# Patient Record
Sex: Male | Born: 2000 | Race: White | Hispanic: No | Marital: Single | Smoking: Former smoker
Health system: Southern US, Community
[De-identification: ages and names within clinical notes are randomized; demographics above are authoritative.]

## PROBLEM LIST (undated history)

## (undated) DIAGNOSIS — K589 Irritable bowel syndrome without diarrhea: Secondary | ICD-10-CM

## (undated) DIAGNOSIS — K219 Gastro-esophageal reflux disease without esophagitis: Secondary | ICD-10-CM

## (undated) DIAGNOSIS — J45909 Unspecified asthma, uncomplicated: Secondary | ICD-10-CM

## (undated) HISTORY — DX: Gastro-esophageal reflux disease without esophagitis: K21.9

## (undated) HISTORY — DX: Irritable bowel syndrome, unspecified: K58.9

## (undated) HISTORY — DX: Unspecified asthma, uncomplicated: J45.909

---
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
## 2018-01-20 ENCOUNTER — Encounter: Payer: Self-pay | Admitting: Family Medicine

## 2018-01-20 ENCOUNTER — Ambulatory Visit: Payer: BLUE CROSS/BLUE SHIELD | Admitting: Family Medicine

## 2018-01-20 VITALS — BP 117/78 | Temp 98.3°F | Ht 70.35 in | Wt 144.6 lb

## 2018-01-20 DIAGNOSIS — J019 Acute sinusitis, unspecified: Secondary | ICD-10-CM

## 2018-01-20 MED ORDER — AMOXICILLIN-POT CLAVULANATE 875-125 MG PO TABS: ORAL_TABLET | ORAL | 0 refills | Status: DC

## 2018-01-20 NOTE — Progress Notes (Signed)
   Subjective:    Patient ID: Jeffery Arnold, male    DOB: 01-03-2001, 17 y.o.   MRN: [id number]  Sore Throat   This is a new problem. The current episode started in the past 7 days. Associated symptoms include congestion and coughing. Associated symptoms comments: Runny nose, fatigue, fever yesterday. Treatments tried: sinus med. The treatment provided mild relief.   Senior yr, working now at [employer]   ttired and [employer] got worse, has had some spring allergy cough too   Probable low gr fever  Headache at first  [employer] disch from sinuses and chest  Review of Systems  HENT: Positive for congestion.   Respiratory: Positive for cough.        Objective:   Physical Exam  Alert, mild malaise. Hydration good Vitals stable. frontal/ maxillary tenderness evident positive nasal congestion. pharynx normal neck supple  lungs clear/no crackles or wheezes. heart regular in rhythm       Assessment & Plan:  Impression rhinosinusitis likely post viral, discussed with patient. plan antibiotics prescribed. Questions answered. Symptomatic care discussed. warning signs discussed. WSL

## 2018-05-09 DIAGNOSIS — W57XXXA Bitten or stung by nonvenomous insect and other nonvenomous arthropods, initial encounter: Secondary | ICD-10-CM | POA: Diagnosis not present

## 2018-05-09 DIAGNOSIS — S70361A Insect bite (nonvenomous), right thigh, initial encounter: Secondary | ICD-10-CM | POA: Diagnosis not present

## 2019-04-09 ENCOUNTER — Other Ambulatory Visit: Payer: Self-pay | Admitting: Family Medicine

## 2019-04-09 NOTE — Telephone Encounter (Signed)
Please contact patient and schedule appt. Thank you. 

## 2019-04-12 NOTE — Telephone Encounter (Signed)
Appt scheduled pt aware 

## 2019-04-15 ENCOUNTER — Ambulatory Visit (INDEPENDENT_AMBULATORY_CARE_PROVIDER_SITE_OTHER): Payer: BC Managed Care – PPO | Admitting: Family Medicine

## 2019-04-15 ENCOUNTER — Ambulatory Visit: Payer: BLUE CROSS/BLUE SHIELD | Admitting: Family Medicine

## 2019-04-15 ENCOUNTER — Other Ambulatory Visit: Payer: Self-pay

## 2019-04-15 DIAGNOSIS — J4520 Mild intermittent asthma, uncomplicated: Secondary | ICD-10-CM | POA: Diagnosis not present

## 2019-04-15 MED ORDER — ALBUTEROL SULFATE HFA 108 (90 BASE) MCG/ACT IN AERS: INHALATION_SPRAY | RESPIRATORY_TRACT | 2 refills | Status: DC

## 2019-04-15 NOTE — Progress Notes (Signed)
   Subjective:  AudioPatient ID: Jeffery Arnold, male    DOB: 05/26/2001, 18 y.o.   MRN: [id number] Plus video HPI Follow up on asthma. Needs refill on albuterol inhaler. 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   Virtual Visit via Video Note  I connected with Jeffery Arnold on 04/15/19 at  9:30 AM EDT by a video enabled telemedicine application and verified that I am speaking with the correct person using two identifiers.  Location: Patient: home Provider: office   I discussed the limitations of evaluation and management by telemedicine and the availability of in person appointments. The patient expressed understanding and agreed to proceed.  History of Present Illness:    Observations/Objective:   Assessment and Plan:   Follow Up Instructions:    I discussed the assessment and treatment plan with the patient. The patient was provided an opportunity to ask questions and all were answered. The patient agreed with the plan and demonstrated an understanding of the instructions.   The patient was advised to call back or seek an in-person evaluation if the symptoms worsen or if the condition fails to improve as anticipated.  I provided 16 minutes of non-face-to-face time during this encounter.       Review of Systems No headache, no major weight loss or weight gain, no chest pain no back pain abdominal pain no change in bowel habits complete ROS otherwise negative     Objective:   Physical Exam   Virtual     Assessment & Plan:  Impression history of asthma.  Overall stable now.  Now working in high irritant/chemical environment.  He anticipates intermittent flares and will need albuterol on hand.  Proper use discussed warning signs discussed

## 2019-09-23 ENCOUNTER — Ambulatory Visit: Payer: BC Managed Care – PPO | Attending: Internal Medicine

## 2019-09-23 ENCOUNTER — Other Ambulatory Visit: Payer: Self-pay

## 2019-09-23 DIAGNOSIS — Z20828 Contact with and (suspected) exposure to other viral communicable diseases: Secondary | ICD-10-CM | POA: Diagnosis not present

## 2019-09-23 DIAGNOSIS — Z20822 Contact with and (suspected) exposure to covid-19: Secondary | ICD-10-CM

## 2019-09-25 LAB — NOVEL CORONAVIRUS, NAA: SARS-CoV-2, NAA: NOT DETECTED

## 2019-10-20 ENCOUNTER — Encounter: Payer: Self-pay | Admitting: Family Medicine

## 2019-10-21 ENCOUNTER — Encounter: Payer: Self-pay | Admitting: Family Medicine

## 2019-11-01 ENCOUNTER — Ambulatory Visit: Payer: BC Managed Care – PPO | Attending: Internal Medicine

## 2019-11-01 ENCOUNTER — Other Ambulatory Visit: Payer: Self-pay

## 2019-11-01 DIAGNOSIS — Z20822 Contact with and (suspected) exposure to covid-19: Secondary | ICD-10-CM | POA: Insufficient documentation

## 2019-11-02 LAB — NOVEL CORONAVIRUS, NAA: SARS-CoV-2, NAA: NOT DETECTED

## 2019-12-31 ENCOUNTER — Ambulatory Visit
Admission: EM | Admit: 2019-12-31 | Discharge: 2019-12-31 | Disposition: A | Discharge status: Home / Self Care | Payer: BC Managed Care – PPO | Attending: Emergency Medicine | Admitting: Emergency Medicine

## 2019-12-31 ENCOUNTER — Other Ambulatory Visit: Payer: Self-pay

## 2019-12-31 ENCOUNTER — Encounter: Payer: Self-pay | Admitting: Emergency Medicine

## 2019-12-31 DIAGNOSIS — R05 Cough: Secondary | ICD-10-CM

## 2019-12-31 DIAGNOSIS — Z20822 Contact with and (suspected) exposure to covid-19: Secondary | ICD-10-CM

## 2019-12-31 DIAGNOSIS — J029 Acute pharyngitis, unspecified: Secondary | ICD-10-CM | POA: Diagnosis not present

## 2019-12-31 DIAGNOSIS — R059 Cough, unspecified: Secondary | ICD-10-CM

## 2019-12-31 LAB — POC SARS CORONAVIRUS 2 AG -  ED: SARS Coronavirus 2 Ag: NEGATIVE

## 2019-12-31 MED ORDER — CETIRIZINE HCL 10 MG PO TABS: 10.0000 mg | ORAL_TABLET | Freq: Every day | ORAL | 0 refills | Status: DC

## 2019-12-31 MED ORDER — BENZONATATE 100 MG PO CAPS: 100.0000 mg | ORAL_CAPSULE | Freq: Three times a day (TID) | ORAL | 0 refills | Status: DC

## 2019-12-31 MED ORDER — FLUTICASONE PROPIONATE 50 MCG/ACT NA SUSP: 1.0000 | Freq: Every day | NASAL | 0 refills | Status: DC

## 2019-12-31 NOTE — ED Provider Notes (Signed)
[facility]    CSN: [id number] Arrival date & time: 12/31/19  1833      History   Chief Complaint Covid exposure  HPI Jeffery Arnold is a 19 y.o. male.   who presents for COVID testing after Covid exposure.  Roommate tested positive today.  Report cough, sore throat and fever.  Denies sick exposure to COVID, flu or strep.  Denies recent travel.  Denies aggravating or alleviating symptoms.  Denies previous COVID infection.   Denies fever, chills, fatigue, nasal congestion, rhinorrhea, SOB, wheezing, chest pain, nausea, vomiting, changes in bowel or bladder habits.     The history is provided by the patient. No language interpreter was used.    Past Medical History:  Diagnosis Date  . Asthma   . GERD (gastroesophageal reflux disease)     Patient Active Problem List   Diagnosis Date Noted  . Asthma, chronic 08/25/2013    History reviewed. No pertinent surgical history.     Home Medications    Prior to Admission medications   Medication Sig Start Date End Date Taking? Authorizing Provider  albuterol (VENTOLIN HFA) 108 (90 Base) MCG/ACT inhaler Two sprays qid prn wheezing 04/15/19   Mikey Kirschner, MD  PROAIR HFA 108 [phone] Base) MCG/ACT inhaler INHALE 2 PUFFS INTO THE LUNGS EVERY 4 HOURS AS NEEDED FOR WHEEZING ORSHORNTESS OF BREATH. 04/13/19   Mikey Kirschner, MD    Family History Family History  Problem Relation Age of Onset  . Diabetes Other     Social History Social History   Tobacco Use  . Smoking status: Former Research scientist (life sciences)  . Smokeless tobacco: Never Used  Substance Use Topics  . Alcohol use: Not on file  . Drug use: Not on file     Allergies   Patient has no known allergies.   Review of Systems Review of Systems  Constitutional: Positive for fever.  HENT: Positive for sore throat.   Respiratory: Positive for cough.   Cardiovascular: Negative.   Gastrointestinal: Negative.   Neurological: Negative.   All other systems reviewed and  are negative.    Physical Exam Triage Vital Signs ED Triage Vitals [12/31/19 1838]  Enc Vitals Group     BP (!) 147/84     Pulse Rate (!) 121     Resp 17     Temp 100.1 F (37.8 C)     Temp Source Oral     SpO2 96 %     Weight      Height      Head Circumference      Peak Flow      Pain Score      Pain Loc      Pain Edu?      Excl. in [place]?    No data found.  Updated Vital Signs BP (!) 147/84 (BP Location: Right Arm)   Pulse (!) 121   Temp 100.1 F (37.8 C) (Oral)   Resp 17   SpO2 96%   Visual Acuity Right Eye Distance:   Left Eye Distance:   Bilateral Distance:    Right Eye Near:   Left Eye Near:    Bilateral Near:     Physical Exam Vitals and nursing note reviewed.  Constitutional:      General: He is not in acute distress.    Appearance: Normal appearance. He is normal weight. He is not ill-appearing or toxic-appearing.  HENT:     Head: Normocephalic.     Right Ear:  Tympanic membrane, ear canal and external ear normal. There is no impacted cerumen.     Left Ear: Tympanic membrane, ear canal and external ear normal. There is no impacted cerumen.     Nose: Nose normal. No congestion.     Mouth/Throat:     Mouth: Mucous membranes are moist.     Pharynx: Oropharynx is clear. No oropharyngeal exudate or posterior oropharyngeal erythema.  Cardiovascular:     Rate and Rhythm: Regular rhythm. Tachycardia present.     Pulses: Normal pulses.     Heart sounds: Normal heart sounds. No murmur.  Pulmonary:     Effort: Pulmonary effort is normal. No respiratory distress.     Breath sounds: Normal breath sounds. No wheezing or rhonchi.  Chest:     Chest wall: No tenderness.  Neurological:     Mental Status: He is alert and oriented to person, place, and time.      UC Treatments / Results  Labs (all labs ordered are listed, but only abnormal results are displayed) Labs Reviewed  NOVEL CORONAVIRUS, NAA  POC SARS CORONAVIRUS 2 AG -  ED     EKG   Radiology No results found.  Procedures Procedures (including critical care time)  Medications Ordered in UC Medications - No data to display  Initial Impression / Assessment and Plan / UC Course  I have reviewed the triage vital signs and the nursing notes.  Pertinent labs & imaging results that were available during my care of the patient were reviewed by me and considered in my medical decision making (see chart for details).    POCT COVID-19 test was negative.  PCR COVID-19 test was ordered.  Flonase, Tessalon Perles, Zyrtec were prescribed.  Was advised to quarantine.  Work note was given.  Return or go to ED for worsening of symptoms  Final Clinical Impressions(s) / UC Diagnoses   Final diagnoses:  Close exposure to COVID-19 virus  Cough  Sore throat     Discharge Instructions     COVID testing ordered.  It will take between 2-7 days for test results.  Someone will contact you regarding abnormal results.    In the meantime: You should remain isolated in your home for 10 days from symptom onset AND greater than 24 hours after symptoms resolution (absence of fever without the use of fever-reducing medication and improvement in respiratory symptoms), whichever is longer Get plenty of rest and push fluids Tessalon Perles prescribed for cough Zyrtec-D prescribed for nasal congestion, runny nose, and/or sore throat Flonase prescribed for nasal congestion and runny nose Use medications daily for symptom relief Use OTC medications like ibuprofen or tylenol as needed fever or pain Call or go to the ED if you have any new or worsening symptoms such as fever, worsening cough, shortness of breath, chest tightness, chest pain, turning blue, changes in mental status, etc...     ED Prescriptions    None     PDMP not reviewed this encounter.   Durward Parcel, FNP 12/31/19 1907

## 2019-12-31 NOTE — ED Triage Notes (Signed)
Pt seen by provider prior to RN, see provider note  

## 2019-12-31 NOTE — Discharge Instructions (Addendum)
COVID testing ordered.  It will take between 2-7 days for test results.  Someone will contact you regarding abnormal results.    In the meantime: You should remain isolated in your home for 10 days from symptom onset AND greater than 24 hours after symptoms resolution (absence of fever without the use of fever-reducing medication and improvement in respiratory symptoms), whichever is longer Get plenty of rest and push fluids Tessalon Perles prescribed for cough Zyrtec-D prescribed for nasal congestion, runny nose, and/or sore throat Flonase prescribed for nasal congestion and runny nose Use medications daily for symptom relief Use OTC medications like ibuprofen or tylenol as needed fever or pain Call or go to the ED if you have any new or worsening symptoms such as fever, worsening cough, shortness of breath, chest tightness, chest pain, turning blue, changes in mental status, etc...  

## 2020-01-02 LAB — SARS-COV-2, NAA 2 DAY TAT

## 2020-01-02 LAB — NOVEL CORONAVIRUS, NAA: SARS-CoV-2, NAA: DETECTED — AB

## 2020-03-22 ENCOUNTER — Other Ambulatory Visit: Payer: Self-pay | Admitting: Family Medicine

## 2020-03-22 NOTE — Telephone Encounter (Signed)
Please contact patient to have patient set up appt with Dr.Taylor. Thank you!

## 2020-03-22 NOTE — Telephone Encounter (Signed)
Scheduled 7/22

## 2020-04-05 ENCOUNTER — Ambulatory Visit
Admission: EM | Admit: 2020-04-05 | Discharge: 2020-04-05 | Disposition: A | Discharge status: Home / Self Care | Payer: BC Managed Care – PPO | Attending: Emergency Medicine | Admitting: Emergency Medicine

## 2020-04-05 ENCOUNTER — Other Ambulatory Visit: Payer: Self-pay

## 2020-04-05 DIAGNOSIS — W57XXXA Bitten or stung by nonvenomous insect and other nonvenomous arthropods, initial encounter: Secondary | ICD-10-CM

## 2020-04-05 DIAGNOSIS — S40862A Insect bite (nonvenomous) of left upper arm, initial encounter: Secondary | ICD-10-CM | POA: Diagnosis not present

## 2020-04-05 MED ORDER — DOXYCYCLINE HYCLATE 100 MG PO CAPS: 100.0000 mg | ORAL_CAPSULE | Freq: Two times a day (BID) | ORAL | 0 refills | Status: DC

## 2020-04-05 NOTE — ED Provider Notes (Signed)
[facility]   [id number] 04/05/20 Arrival Time: 1559   CC: Insect bite  SUBJECTIVE:  KASHMERE STAFFA is a 19 y.o. male who presents with a possible insect bite to LT forearm x 2 days.  Speculates he may have been bitten by a spider.  Has tried OTC salve without relief.  Reports similar symptoms in the past and treated with antibiotic.  Complains of associated pain, redness, and swelling.  Denies fever, chills, nausea, vomiting, discharge.    ROS: As per HPI.  All other pertinent ROS negative.     Past Medical History:  Diagnosis Date  . Asthma   . GERD (gastroesophageal reflux disease)    History reviewed. No pertinent surgical history. No Known Allergies No current facility-administered medications on file prior to encounter.   Current Outpatient Medications on File Prior to Encounter  Medication Sig Dispense Refill  . [DISCONTINUED] cetirizine (ZYRTEC ALLERGY) 10 MG tablet Take 1 tablet (10 mg total) by mouth daily. 30 tablet 0  . [DISCONTINUED] fluticasone (FLONASE) 50 MCG/ACT nasal spray Place 1 spray into both nostrils daily for 14 days. 16 g 0  . [DISCONTINUED] PROAIR HFA 108 (90 Base) MCG/ACT inhaler INHALE 2 PUFFS INTO THE LUNGS EVERY 4 HOURS AS NEEDED FOR WHEEZING OR SHORNTESS OF BREATH. 8.5 g 0   Social History   Socioeconomic History  . Marital status: Single    Spouse name: Not on file  . Number of children: Not on file  . Years of education: Not on file  . Highest education level: Not on file  Occupational History  . Not on file  Tobacco Use  . Smoking status: Former Games developer  . Smokeless tobacco: Never Used  Substance and Sexual Activity  . Alcohol use: Not on file  . Drug use: Not on file  . Sexual activity: Not on file  Other Topics Concern  . Not on file  Social History Narrative  . Not on file   Social Determinants of Health   Financial Resource Strain:   . Difficulty of Paying Living Expenses:   Food Insecurity:   . Worried About  Programme researcher, broadcasting/film/video in the Last Year:   . Barista in the Last Year:   Transportation Needs:   . Freight forwarder (Medical):   Marland Kitchen Lack of Transportation (Non-Medical):   Physical Activity:   . Days of Exercise per Week:   . Minutes of Exercise per Session:   Stress:   . Feeling of Stress :   Social Connections:   . Frequency of Communication with Friends and Family:   . Frequency of Social Gatherings with Friends and Family:   . Attends Religious Services:   . Active Member of Clubs or Organizations:   . Attends Banker Meetings:   Marland Kitchen Marital Status:   Intimate Partner Violence:   . Fear of Current or Ex-Partner:   . Emotionally Abused:   Marland Kitchen Physically Abused:   . Sexually Abused:    Family History  Problem Relation Age of Onset  . Diabetes Other     OBJECTIVE:  Vitals:   04/05/20 1613  BP: 132/79  Pulse: 86  Resp: 18  Temp: 98.4 F (36.9 C)  SpO2: 98%     General appearance: alert; no distress CV: radial pulse 2+ Skin: 1 cm induration of his anterior LT forearm with surrounding erythema; tender to touch; no active drainage Psychological: alert and cooperative; normal mood and affect  ASSESSMENT & PLAN:  1. Insect bite of left upper extremity, initial encounter     Meds ordered this encounter  Medications  . doxycycline (VIBRAMYCIN) 100 MG capsule    Sig: Take 1 capsule (100 mg total) by mouth 2 (two) times daily.    Dispense:  20 capsule    Refill:  0    Order Specific Question:   Supervising Provider    Answer:   Eustace Moore [[id number]]   Wash with warm water and mild soap Keep covered Prescribed doxycycline take as directed and to completion Continue to alternate ibuprofen and tylenol as needed for pain and fever Follow up with PCP if symptoms persists Return or go to the ED if you have any new or worsening symptoms such as increased pain, redness, swelling, discharge, high fever, night sweats, abdominal pain, etc...    Reviewed expectations re: course of current medical issues. Questions answered. Outlined signs and symptoms indicating need for more acute intervention. Patient verbalized understanding. After Visit Summary given.          Rennis Harding, PA-C 04/05/20 1623

## 2020-04-05 NOTE — Discharge Instructions (Addendum)
Wash with warm water and mild soap Keep covered Prescribed doxycycline take as directed and to completion Continue to alternate ibuprofen and tylenol as needed for pain and fever Follow up with PCP if symptoms persists Return or go to the ED if you have any new or worsening symptoms such as increased pain, redness, swelling, discharge, high fever, night sweats, abdominal pain, etc..Marland Kitchen

## 2020-04-05 NOTE — ED Triage Notes (Signed)
Pt presents with what he thinks is a spider bite. Reports it happened two days ago. Pt has red area an inch and a half in length on his left lower arm. Pt reports taking benadryl at home with no relief. Reports the area is warm and painful.

## 2020-04-13 ENCOUNTER — Encounter: Payer: Self-pay | Admitting: Family Medicine

## 2020-04-13 ENCOUNTER — Other Ambulatory Visit: Payer: Self-pay

## 2020-04-13 ENCOUNTER — Ambulatory Visit: Payer: BC Managed Care – PPO | Admitting: Family Medicine

## 2020-04-13 VITALS — BP 120/72 | HR 85 | Temp 97.1°F | Ht 70.0 in | Wt 161.8 lb

## 2020-04-13 DIAGNOSIS — J4520 Mild intermittent asthma, uncomplicated: Secondary | ICD-10-CM

## 2020-04-13 MED ORDER — ALBUTEROL SULFATE HFA 108 (90 BASE) MCG/ACT IN AERS: 2.0000 | INHALATION_SPRAY | Freq: Four times a day (QID) | RESPIRATORY_TRACT | 3 refills | Status: DC | PRN

## 2020-04-13 NOTE — Progress Notes (Signed)
Patient ID: Jeffery Arnold, male    DOB: 05-Dec-2000, 19 y.o.   MRN: [id number]   Chief Complaint  Patient presents with   asthma follow up    doing well -needs refill on inhaler   Subjective:    HPI Doing well.  Dust making asthma flare up when at work.  Pt working in Aeronautical engineer.  Only needing inhaler 1x per week. No other symptoms. Had covid near  4/21- had bad headache and otherwise was okay and healed ok.   Using chewing tobacco.  Pt trying to cut back.  Medical History Jeffery Arnold has a past medical history of Asthma and GERD (gastroesophageal reflux disease).   Outpatient Encounter Medications as of 04/13/2020  Medication Sig   albuterol (VENTOLIN HFA) 108 (90 Base) MCG/ACT inhaler Inhale 2 puffs into the lungs every 6 (six) hours as needed for wheezing or shortness of breath. proair   [DISCONTINUED] albuterol (VENTOLIN HFA) 108 (90 Base) MCG/ACT inhaler Inhale into the lungs every 6 (six) hours as needed for wheezing or shortness of breath. proair   [DISCONTINUED] cetirizine (ZYRTEC ALLERGY) 10 MG tablet Take 1 tablet (10 mg total) by mouth daily.   [DISCONTINUED] doxycycline (VIBRAMYCIN) 100 MG capsule Take 1 capsule (100 mg total) by mouth 2 (two) times daily.   [DISCONTINUED] fluticasone (FLONASE) 50 MCG/ACT nasal spray Place 1 spray into both nostrils daily for 14 days.   [DISCONTINUED] PROAIR HFA 108 (90 Base) MCG/ACT inhaler INHALE 2 PUFFS INTO THE LUNGS EVERY 4 HOURS AS NEEDED FOR WHEEZING OR SHORNTESS OF BREATH.   No facility-administered encounter medications on file as of 04/13/2020.     Review of Systems  Constitutional: Negative for chills and fever.  HENT: Negative for congestion, rhinorrhea and sore throat.   Respiratory: Negative for cough, shortness of breath and wheezing.   Cardiovascular: Negative for chest pain and leg swelling.  Gastrointestinal: Negative for abdominal pain, diarrhea, nausea and vomiting.  Genitourinary: Negative for dysuria and  frequency.  Skin: Negative for rash.  Neurological: Negative for dizziness, weakness and headaches.     Vitals BP 120/72    Pulse 85    Temp (!) 97.1 F (36.2 C) (Oral)    Ht 5\' 10"  (1.778 m)    Wt 161 lb 12.8 oz (73.4 kg)    SpO2 97%    BMI 23.22 kg/m   Objective:   Physical Exam Vitals and nursing note reviewed.  Constitutional:      General: He is not in acute distress.    Appearance: Normal appearance. He is not ill-appearing.  Cardiovascular:     Rate and Rhythm: Normal rate and regular rhythm.     Pulses: Normal pulses.     Heart sounds: Normal heart sounds. No murmur heard.   Pulmonary:     Effort: Pulmonary effort is normal. No respiratory distress.     Breath sounds: Normal breath sounds. No wheezing, rhonchi or rales.  Musculoskeletal:        General: Normal range of motion.  Skin:    General: Skin is warm and dry.     Findings: No rash.  Neurological:     General: No focal deficit present.     Mental Status: He is alert and oriented to person, place, and time.  Psychiatric:        Mood and Affect: Mood normal.        Behavior: Behavior normal.        Thought Content: Thought content normal.  Judgment: Judgment normal.      Assessment and Plan   1. Mild intermittent asthma without complication - albuterol (VENTOLIN HFA) 108 (90 Base) MCG/ACT inhaler; Inhale 2 puffs into the lungs every 6 (six) hours as needed for wheezing or shortness of breath. proair  Dispense: 18 g; Refill: 3   Call or rto if worsening.  Pt in agreement,  f/u prn.

## 2020-05-15 ENCOUNTER — Other Ambulatory Visit: Payer: Self-pay

## 2020-05-15 ENCOUNTER — Ambulatory Visit: Payer: BC Managed Care – PPO | Admitting: Family Medicine

## 2020-05-15 ENCOUNTER — Encounter: Payer: Self-pay | Admitting: Family Medicine

## 2020-05-15 VITALS — Temp 97.5°F | Ht 70.0 in | Wt 165.0 lb

## 2020-05-15 DIAGNOSIS — R3589 Other polyuria: Secondary | ICD-10-CM

## 2020-05-15 DIAGNOSIS — R358 Other polyuria: Secondary | ICD-10-CM

## 2020-05-15 DIAGNOSIS — R300 Dysuria: Secondary | ICD-10-CM

## 2020-05-15 LAB — POCT URINALYSIS DIPSTICK
Spec Grav, UA: 1.015 (ref 1.010–1.025)
pH, UA: 7 (ref 5.0–8.0)

## 2020-05-15 LAB — POCT GLUCOSE (DEVICE FOR HOME USE): POC Glucose: 97 mg/dl (ref 70–99)

## 2020-05-15 MED ORDER — CEPHALEXIN 500 MG PO CAPS: 500.0000 mg | ORAL_CAPSULE | Freq: Three times a day (TID) | ORAL | 0 refills | Status: DC

## 2020-05-15 NOTE — Progress Notes (Signed)
   Subjective:    Patient ID: Jeffery Arnold, male    DOB: 05-27-2001, 19 y.o.   MRN: [id number]  HPIpain with urination and frequency for 5 days.  Patient denies excessive thirst but states at times he feels like he needs to drink a lot Patient also relates some intermittent dysuria but that is now gone relates urinary frequency denies rectal pain denies hematuria not sexually active Results for orders placed or performed in visit on 05/15/20  POCT urinalysis dipstick  Result Value Ref Range   Color, UA     Clarity, UA     Glucose, UA     Bilirubin, UA     Ketones, UA     Spec Grav, UA 1.015 1.010 - 1.025   Blood, UA     pH, UA 7.0 5.0 - 8.0   Protein, UA     Urobilinogen, UA     Nitrite, UA     Leukocytes, UA     Appearance     Odor        Review of Systems See above    Objective:   Physical Exam Flanks nontender abdomen soft patient denies urethral discharge urine under the microscope looks good send for culture       Assessment & Plan:  Doubt STD because patient not sexually active Possibly prostate related possibly UTI send culture Keflex 3 times daily for 7 days no sign of diabetes on glucose notify us if any problems

## 2020-05-17 LAB — SPECIMEN STATUS REPORT

## 2020-05-17 LAB — URINE CULTURE: Organism ID, Bacteria: NO GROWTH

## 2020-08-01 ENCOUNTER — Telehealth: Payer: Self-pay

## 2020-08-01 ENCOUNTER — Telehealth: Payer: Self-pay | Admitting: *Deleted

## 2020-08-01 NOTE — Telephone Encounter (Signed)
Patient has physical for 11/22 and needing labs done

## 2020-08-01 NOTE — Telephone Encounter (Signed)
Would wait till appt to see if needing labs.  Usually at this age usually just a cholesterol panel, unless having other concerns.    Dr. Ladona Ridgel

## 2020-08-01 NOTE — Telephone Encounter (Signed)
No prior labs in epic

## 2020-08-07 NOTE — Telephone Encounter (Signed)
error 

## 2020-08-14 ENCOUNTER — Encounter: Payer: Self-pay | Admitting: Family Medicine

## 2020-08-14 ENCOUNTER — Other Ambulatory Visit: Payer: Self-pay

## 2020-08-14 ENCOUNTER — Ambulatory Visit (INDEPENDENT_AMBULATORY_CARE_PROVIDER_SITE_OTHER): Payer: No Typology Code available for payment source | Admitting: Family Medicine

## 2020-08-14 VITALS — BP 118/80 | HR 105 | Temp 98.2°F | Ht 69.5 in | Wt 159.2 lb

## 2020-08-14 DIAGNOSIS — Z0000 Encounter for general adult medical examination without abnormal findings: Secondary | ICD-10-CM | POA: Diagnosis not present

## 2020-08-14 DIAGNOSIS — Z13220 Encounter for screening for lipoid disorders: Secondary | ICD-10-CM | POA: Diagnosis not present

## 2020-08-14 DIAGNOSIS — Z1159 Encounter for screening for other viral diseases: Secondary | ICD-10-CM | POA: Diagnosis not present

## 2020-08-14 NOTE — Patient Instructions (Signed)
Preventive Care 48-21 Years Old, Male Preventive care refers to lifestyle choices and visits with your health care provider that can promote health and wellness. This includes:  A yearly physical exam. This is also called an annual well check.  Regular dental and eye exams.  Immunizations.  Screening for certain conditions.  Healthy lifestyle choices, such as eating a healthy diet, getting regular exercise, not using drugs or products that contain nicotine and tobacco, and limiting alcohol use. What can I expect for my preventive care visit? Physical exam Your health care provider will check:  Height and weight. These may be used to calculate body mass index (BMI), which is a measurement that tells if you are at a healthy weight.  Heart rate and blood pressure.  Your skin for abnormal spots. Counseling Your health care provider may ask you questions about:  Alcohol, tobacco, and drug use.  Emotional well-being.  Home and relationship well-being.  Sexual activity.  Eating habits.  Work and work Statistician. What immunizations do I need?  Influenza (flu) vaccine  This is recommended every year. Tetanus, diphtheria, and pertussis (Tdap) vaccine  You may need a Td booster every 10 years. Varicella (chickenpox) vaccine  You may need this vaccine if you have not already been vaccinated. Human papillomavirus (HPV) vaccine  If recommended by your health care provider, you may need three doses over 6 months. Measles, mumps, and rubella (MMR) vaccine  You may need at least one dose of MMR. You may also need a second dose. Meningococcal conjugate (MenACWY) vaccine  One dose is recommended if you are 47-13 years old and a Market researcher living in a residence hall, or if you have one of several medical conditions. You may also need additional booster doses. Pneumococcal conjugate (PCV13) vaccine  You may need this if you have certain conditions and were not  previously vaccinated. Pneumococcal polysaccharide (PPSV23) vaccine  You may need one or two doses if you smoke cigarettes or if you have certain conditions. Hepatitis A vaccine  You may need this if you have certain conditions or if you travel or work in places where you may be exposed to hepatitis A. Hepatitis B vaccine  You may need this if you have certain conditions or if you travel or work in places where you may be exposed to hepatitis B. Haemophilus influenzae type b (Hib) vaccine  You may need this if you have certain risk factors. You may receive vaccines as individual doses or as more than one vaccine together in one shot (combination vaccines). Talk with your health care provider about the risks and benefits of combination vaccines. What tests do I need? Blood tests  Lipid and cholesterol levels. These may be checked every 5 years starting at age 5.  Hepatitis C test.  Hepatitis B test. Screening   Diabetes screening. This is done by checking your blood sugar (glucose) after you have not eaten for a while (fasting).  Sexually transmitted disease (STD) testing. Talk with your health care provider about your test results, treatment options, and if necessary, the need for more tests. Follow these instructions at home: Eating and drinking   Eat a diet that includes fresh fruits and vegetables, whole grains, lean protein, and low-fat dairy products.  Take vitamin and mineral supplements as recommended by your health care provider.  Do not drink alcohol if your health care provider tells you not to drink.  If you drink alcohol: ? Limit how much you have to 0-2  drinks a day. ? Be aware of how much alcohol is in your drink. In the [place] one drink equals one 12 oz bottle of beer (355 mL), one 5 oz glass of wine (148 mL), or one 1 oz glass of hard liquor (44 mL). Lifestyle  Take daily care of your teeth and gums.  Stay active. Exercise for at least 30 minutes on 5 or  more days each week.  Do not use any products that contain nicotine or tobacco, such as cigarettes, e-cigarettes, and chewing tobacco. If you need help quitting, ask your health care provider.  If you are sexually active, practice safe sex. Use a condom or other form of protection to prevent STIs (sexually transmitted infections). What's next?  Go to your health care provider once a year for a well check visit.  Ask your health care provider how often you should have your eyes and teeth checked.  Stay up to date on all vaccines. This information is not intended to replace advice given to you by your health care provider. Make sure you discuss any questions you have with your health care provider. Document Revised: 09/03/2018 Document Reviewed: 09/03/2018 Elsevier Patient Education  2020 [employer].

## 2020-08-14 NOTE — Progress Notes (Signed)
The patient comes in today for a wellness visit.    A review of their health history was completed.  A review of medications was also completed.  Any needed refills; none  Eating habits: eats well  Falls/  MVA accidents in past few months: none  Regular exercise: pretty much   Specialist pt sees on regular basis: none  Preventative health issues were discussed.   Additional concerns: none    Patient ID: Jeffery Arnold, male    DOB: 10/12/2000, 19 y.o.   MRN: [id number]   Chief Complaint  Patient presents with  . Annual Exam   Subjective:  CC: physical for job requirement.  Presents today for a routine physical, he has a new job with the city of [place] and and this is required.  He has mild intermittent asthma, well controlled.  He declines his flu vaccine today.  He has no health concerns, does chew tobacco,, trying to quit.    Medical History Bertram has a past medical history of Asthma and GERD (gastroesophageal reflux disease).   Outpatient Encounter Medications as of 08/14/2020  Medication Sig  . [DISCONTINUED] albuterol (VENTOLIN HFA) 108 (90 Base) MCG/ACT inhaler Inhale 2 puffs into the lungs every 6 (six) hours as needed for wheezing or shortness of breath. proair  . [DISCONTINUED] cephALEXin (KEFLEX) 500 MG capsule Take 1 capsule (500 mg total) by mouth 3 (three) times daily.  . [DISCONTINUED] cetirizine (ZYRTEC ALLERGY) 10 MG tablet Take 1 tablet (10 mg total) by mouth daily.  . [DISCONTINUED] fluticasone (FLONASE) 50 MCG/ACT nasal spray Place 1 spray into both nostrils daily for 14 days.   No facility-administered encounter medications on file as of 08/14/2020.     Review of Systems  Constitutional: Negative for chills and fever.  Respiratory: Negative for shortness of breath.   Cardiovascular: Negative for chest pain.  Gastrointestinal: Negative for abdominal pain.  Genitourinary: Negative for dysuria.       Treated for urinary tract infection in  August, this has resolved.  Musculoskeletal: Negative for back pain and joint swelling.  Skin: Negative for rash.  Neurological: Negative for light-headedness and headaches.  Hematological: Negative for adenopathy.     Vitals BP 118/80   Pulse (!) 105   Temp 98.2 F (36.8 C)   Ht 5' 9.5" (1.765 m)   Wt 159 lb 3.2 oz (72.2 kg)   SpO2 98%   BMI 23.17 kg/m   Objective:   Physical Exam Vitals and nursing note reviewed.  Constitutional:      General: He is not in acute distress.    Appearance: Normal appearance.  HENT:     Head: Normocephalic.     Right Ear: Tympanic membrane normal.     Left Ear: Tympanic membrane normal.     Nose: Nose normal.     Mouth/Throat:     Mouth: Mucous membranes are moist.     Pharynx: Oropharynx is clear.  Cardiovascular:     Rate and Rhythm: Normal rate and regular rhythm.     Pulses: Normal pulses.     Heart sounds: Normal heart sounds.  Pulmonary:     Effort: Pulmonary effort is normal.     Breath sounds: Normal breath sounds.  Abdominal:     General: Abdomen is flat. Bowel sounds are normal.     Palpations: Abdomen is soft.     Tenderness: There is no abdominal tenderness.  Genitourinary:    Comments: Deferred. Musculoskeletal:  General: No swelling or tenderness. Normal range of motion.  Skin:    General: Skin is warm and dry.  Neurological:     General: No focal deficit present.     Mental Status: He is alert and oriented to person, place, and time.  Psychiatric:        Mood and Affect: Mood normal.        Behavior: Behavior normal.        Thought Content: Thought content normal.        Judgment: Judgment normal.    Safety measures appropriate for age discussed. No risky behaviors identified.  Wears seatbelt, drinks approximately 2 beers per weekend, does not drive his car when drinking.  Not sexually active. Immunizations reviewed. Decline flu and HIV today.  Dietary recommendations and physical activity  discussed. New job discussed, hopes to advance his career there.  Routine vision and dental screening discussed. Eye doctor yearly, will start back to dentist with insurance. Questions answered regarding general health.   Follow-up in one year, sooner if needed.     Assessment and Plan   1. Wellness examination - Comprehensive Metabolic Panel (CMET) - Lipid Panel w/o Chol/HDL Ratio - Hepatitis C Antibody  2. Screening for lipid disorders - Lipid Panel w/o Chol/HDL Ratio  3. Encounter for hepatitis C screening test for low risk patient - Hepatitis C Antibody   Physical and labs required for new employment, we will get some baseline labs today (declines HIV).  Declines flu vaccine, reports he always gets sick when he gets the flu shot.  We did discuss the Covid vaccine, he is trying to prepare for when it becomes mandatory, I gave him the pros and cons of each different type of vaccine, encouraged him to go ahead and get the vaccine.  He reports that he did get Covid earlier, his mother is still struggling with symptoms from COVID-19.  Encouraged him to continue to trying to quit chewing tobacco, discussed risk of mouth cancer, encouraged him to let us know if he needed our assistance.  Mild intermittent asthma: Well-controlled, uses inhaler once per month.  Avoids triggers.  Agrees with plan of care discussed today. Understands warning signs to seek further care: Chest pain, shortness of breath, any significant changes in health. Understands to follow-up in 1 year for wellness, sooner if needed.  Encourage self testicular exam.   Novella Olive, NP 08/14/2020

## 2020-08-15 LAB — HEPATITIS C ANTIBODY: Hep C Virus Ab: <0.1 {s_co_ratio} (ref 0.0–0.9)

## 2020-08-15 LAB — COMPREHENSIVE METABOLIC PANEL
ALT: 20 IU/L (ref 0–44)
AST: 15 IU/L (ref 0–40)
Albumin/Globulin Ratio: 2 (ref 1.2–2.2)
Albumin: 5.2 g/dL (ref 4.1–5.2)
Alkaline Phosphatase: 112 IU/L (ref 44–121)
BUN/Creatinine Ratio: 12 (ref 9–20)
BUN: 11 mg/dL (ref 6–20)
Bilirubin Total: 0.6 mg/dL (ref 0.0–1.2)
CO2: 23 mmol/L (ref 20–29)
Calcium: 9.6 mg/dL (ref 8.7–10.2)
Chloride: 101 mmol/L (ref 96–106)
Creatinine, Ser: 0.94 mg/dL (ref 0.76–1.27)
GFR calc Af Amer: 133 mL/min/{1.73_m2} (ref >59)
GFR calc non Af Amer: 115 mL/min/{1.73_m2} (ref >59)
Globulin, Total: 2.6 g/dL (ref 1.5–4.5)
Glucose: 108 mg/dL — ABNORMAL HIGH (ref 65–99)
Potassium: 4 mmol/L (ref 3.5–5.2)
Sodium: 138 mmol/L (ref 134–144)
Total Protein: 7.8 g/dL (ref 6.0–8.5)

## 2020-08-15 LAB — LIPID PANEL W/O CHOL/HDL RATIO
Cholesterol, Total: 153 mg/dL (ref 100–199)
HDL: 47 mg/dL (ref >39)
LDL Chol Calc (NIH): 93 mg/dL (ref 0–99)
Triglycerides: 62 mg/dL (ref 0–149)
VLDL Cholesterol Cal: 13 mg/dL (ref 5–40)

## 2021-01-03 ENCOUNTER — Encounter: Payer: Self-pay | Admitting: Family Medicine

## 2021-01-25 ENCOUNTER — Ambulatory Visit: Payer: No Typology Code available for payment source | Admitting: Family Medicine

## 2021-01-25 ENCOUNTER — Encounter: Payer: Self-pay | Admitting: Family Medicine

## 2021-01-25 VITALS — HR 108 | Temp 100.0°F | Ht 69.5 in

## 2021-01-25 DIAGNOSIS — J45909 Unspecified asthma, uncomplicated: Secondary | ICD-10-CM | POA: Diagnosis not present

## 2021-01-25 DIAGNOSIS — H66001 Acute suppurative otitis media without spontaneous rupture of ear drum, right ear: Secondary | ICD-10-CM | POA: Diagnosis not present

## 2021-01-25 MED ORDER — MONTELUKAST SODIUM 10 MG PO TABS: 10.0000 mg | ORAL_TABLET | Freq: Every day | ORAL | 3 refills | Status: DC

## 2021-01-25 MED ORDER — FLOVENT HFA 44 MCG/ACT IN AERO: 2.0000 | INHALATION_SPRAY | Freq: Two times a day (BID) | RESPIRATORY_TRACT | 1 refills | Status: DC

## 2021-01-25 MED ORDER — AMOXICILLIN 500 MG PO CAPS: 500.0000 mg | ORAL_CAPSULE | Freq: Two times a day (BID) | ORAL | 0 refills | Status: AC

## 2021-01-25 MED ORDER — ALBUTEROL SULFATE HFA 108 (90 BASE) MCG/ACT IN AERS: 2.0000 | INHALATION_SPRAY | Freq: Four times a day (QID) | RESPIRATORY_TRACT | 3 refills | Status: DC | PRN

## 2021-01-25 NOTE — Patient Instructions (Addendum)
Asthma Attack Prevention, Adult Although you may not be able to control the fact that you have asthma, you can take actions to prevent episodes of asthma (asthma attacks). How can this condition affect me? Asthma attacks (flare ups) can cause trouble breathing, wheezing, and coughing. They may keep you from doing activities you like to do. What can increase my risk? Coming into contact with things that cause asthma symptoms (asthma triggers) can put you at risk for an asthma attack. Common asthma triggers include:  Things you are allergic to (allergens), such as: ? Dust mite and cockroach droppings. ? Pet dander. ? Mold. ? Pollen from trees and grasses. ? Food allergies. This might be a specific food or added chemicals called sulfites.  Irritants, such as: ? Weather changes including very cold, dry, or humid air. ? Smoke. This includes campfire smoke, air pollution, and tobacco smoke. ? Strong odors from aerosol sprays and fumes from perfume, candles, and household cleaners.  Other triggers, such as: ? Certain medicines. This includes NSAIDs, such as ibuprofen and aspirin. ? Viral respiratory infections (colds), including runny nose (rhinitis) or infection in the sinuses (sinusitis). ? Activity including exercise, laughing, or crying. ? Not using inhaled medicines (corticosteroids) as told. What actions can I take to prevent an asthma attack?  Stay healthy. Stay up to date on all immunizations as told by your health care provider, including the yearly flu (influenza) vaccine and pneumonia vaccine.  Many asthma attacks can be prevented by carefully following your written asthma action plan. Follow your asthma action plan Work with your health care provider to create a written asthma action plan. This plan should include:  A list of your asthma triggers and how to avoid or reduce them.  A list of symptoms that you may have during an asthma attack.  Information about which medicine  to take, when to take the medicine, and how much of the medicine to take.  Information to help you understand your peak flow measurements.  Daily actions that you can take to prevent (control) your asthma symptoms.  Contact information for your health care providers.  If you have an asthma attack, act quickly. Follow the emergency steps on your written asthma action plan. This may prevent you from needing to go to the hospital. Monitor your asthma. To do this:  Use your peak flow meter every morning and every evening for 2-3 weeks or as told by your health care provider. ? Record the results in a journal. ? A drop in your peak flow numbers on one or more days may mean that you are starting to have an asthma attack, even if you are not having symptoms.  When you have asthma symptoms, write them down in a journal.  Write down in your journal how often you need to use your fast-acting rescue inhaler. If you are using your rescue inhaler more often, it may mean that your asthma is not under control. Talk with your health care provider about adjusting your asthma treatment plan to help you prevent future asthma attacks and gain better control of your condition.   Lifestyle  Avoid or reduce contact with known outdoor allergens by staying indoors, keeping windows closed, and using air conditioning when pollen and mold counts are high.  Do not use any products that contain nicotine or tobacco, such as cigarettes, e-cigarettes, and chewing tobacco. If you need help quitting, ask your health care provider.  If you are overweight, consider a weight-loss plan.  Find  ways to cope with stress and your feelings, such as mindfulness, relaxation, or breathing exercises.  Ask your health care provider if a breathing exercise program (pulmonary rehabilitation) may be helpful to control symptoms and improve your quality of life. Medicines  Take over-the-counter and prescription medicines only as told by  your health care provider.  Do not stop taking your medicine and do not take less medicine even if you are doing well.  Let your health care provider know: ? How often you use your rescue inhaler. ? How often you have symptoms when you are taking your regular medicines. ? If you wake up at night because of asthma symptoms. ? If you have more trouble with your breathing when you exercise.   Activity  Do your normal activities as told by your health care provider. Ask your health care provider what activities are safe for you.  Some people have asthma symptoms or more asthma symptoms when they exercise. This is called exercise-induced bronchoconstriction (EIB). If you have this problem, talk with your health care provider about how to manage EIB. Some tips to follow include: ? Use your fast-acting inhaler before exercise. ? Exercise indoors if it is very cold, humid, or the pollen and mold counts are high. ? Warm up and cool down before and after exercise. ? Stop exercising right away if your asthma symptoms start or get worse. Where to find more information  Asthma and Allergy Foundation of America: www.aafa.org  Centers for Disease Control and Prevention: FootballExhibition.com.br  American Lung Association: www.lung.org  National Heart, Lung, and Blood Institute: PopSteam.is  World Health Organization: https://castaneda-walker.com/ Get help right away if:  You have followed your written asthma action plan and your symptoms are not improving. Summary  Asthma attacks (flare ups) can cause trouble breathing, wheezing, and coughing. They may keep you from doing activities you normally like to do.  Work with your health care provider to create a written asthma action plan.  Do not stop taking your medicine and do not take less medicine even if you are doing well.  Do not use any products that contain nicotine or tobacco, such as cigarettes, e-cigarettes, and chewing tobacco. If you need help quitting, ask  your health care provider. This information is not intended to replace advice given to you by your health care provider. Make sure you discuss any questions you have with your health care provider. Document Revised: 09/07/2019 Document Reviewed: 09/07/2019 Elsevier Patient Education  2021 Elsevier Inc. Otitis Media, Adult  Otitis media is a condition in which the middle ear is red and swollen (inflamed) and full of fluid. The middle ear is the part of the ear that contains bones for hearing as well as air that helps send sounds to the brain. The condition usually goes away on its own. What are the causes? This condition is caused by a blockage in the eustachian tube. The eustachian tube connects the middle ear to the back of the nose. It normally allows air into the middle ear. The blockage is caused by fluid or swelling. Problems that can cause blockage include:  A cold or infection that affects the nose, mouth, or throat.  Allergies.  An irritant, such as tobacco smoke.  Adenoids that have become large. The adenoids are soft tissue located in the back of the throat, behind the nose and the roof of the mouth.  Growth or swelling in the upper part of the throat, just behind the nose (nasopharynx).  Damage to  the ear caused by change in pressure. This is called barotrauma. What are the signs or symptoms? Symptoms of this condition include:  Ear pain.  Fever.  Problems with hearing.  Being tired.  Fluid leaking from the ear.  Ringing in the ear. How is this treated? This condition can go away on its own within 3-5 days. But if the condition is caused by bacteria or does not go away on its own, or if it keeps coming back, your doctor may:  Give you antibiotic medicines.  Give you medicines for pain. Follow these instructions at home:  Take over-the-counter and prescription medicines only as told by your doctor.  If you were prescribed an antibiotic medicine, take it as told  by your doctor. Do not stop taking the antibiotic even if you start to feel better.  Keep all follow-up visits as told by your doctor. This is important. Contact a doctor if:  You have bleeding from your nose.  There is a lump on your neck.  You are not feeling better in 5 days.  You feel worse instead of better. Get help right away if:  You have pain that is not helped with medicine.  You have swelling, redness, or pain around your ear.  You get a stiff neck.  You cannot move part of your face (paralysis).  You notice that the bone behind your ear hurts when you touch it.  You get a very bad headache. Summary  Otitis media means that the middle ear is red, swollen, and full of fluid.  This condition usually goes away on its own.  If the problem does not go away, treatment may be needed. You may be given medicines to treat the infection or to treat your pain.  If you were prescribed an antibiotic medicine, take it as told by your doctor. Do not stop taking the antibiotic even if you start to feel better.  Keep all follow-up visits as told by your doctor. This is important. This information is not intended to replace advice given to you by your health care provider. Make sure you discuss any questions you have with your health care provider. Document Revised: 08/12/2019 Document Reviewed: 08/12/2019 Elsevier Patient Education  2021 [employer].

## 2021-01-25 NOTE — Progress Notes (Signed)
Patient ID: Jeffery Arnold, male    DOB: Jan 13, 2001, 20 y.o.   MRN: [id number]   Chief Complaint  Patient presents with  . chest congestion     Cough X 2 weeks using albuterol inhaler 2x daily    Subjective:  CC: chest congestion, wheezing  This is a new problem.  Presents today for an acute visit with a complaint of chest congestion and wheezing.  Symptoms have been present for 2 weeks.  Has a history of asthma.  Has been using his albuterol inhaler 2 times every day.  Does not feel bad per se.  Denies fever, chills, chest pain.  Endorses congestion, cough, shortness of breath, wheezing, headache.    Medical History Olson has a past medical history of Asthma and GERD (gastroesophageal reflux disease).   Outpatient Encounter Medications as of 01/25/2021  Medication Sig  . amoxicillin (AMOXIL) 500 MG capsule Take 1 capsule (500 mg total) by mouth 2 (two) times daily for 10 days.  . fluticasone (FLOVENT HFA) 44 MCG/ACT inhaler Inhale 2 puffs into the lungs 2 (two) times daily.  . montelukast (SINGULAIR) 10 MG tablet Take 1 tablet (10 mg total) by mouth at bedtime.  . [DISCONTINUED] albuterol (VENTOLIN HFA) 108 (90 Base) MCG/ACT inhaler Inhale into the lungs every 6 (six) hours as needed for wheezing or shortness of breath.  . [DISCONTINUED] fluticasone (FLONASE) 50 MCG/ACT nasal spray Place 1 spray into both nostrils daily for 14 days.  Marland Kitchen albuterol (VENTOLIN HFA) 108 (90 Base) MCG/ACT inhaler Inhale 2 puffs into the lungs every 6 (six) hours as needed for wheezing or shortness of breath.  . [DISCONTINUED] cetirizine (ZYRTEC ALLERGY) 10 MG tablet Take 1 tablet (10 mg total) by mouth daily.   No facility-administered encounter medications on file as of 01/25/2021.     Review of Systems  Constitutional: Negative for chills and fever.  HENT: Positive for congestion. Negative for sore throat.   Respiratory: Positive for cough, shortness of breath and wheezing.   Neurological: Positive  for headaches.       Occasional  with coughing.     Vitals Pulse (!) 108   Temp 100 F (37.8 C)   Ht 5' 9.5" (1.765 m)   SpO2 98%   BMI 23.17 kg/m   Objective:   Physical Exam Vitals reviewed.  HENT:     Right Ear: Tympanic membrane is erythematous.     Left Ear: Tympanic membrane normal.     Nose:     Right Turbinates: Swollen.     Left Turbinates: Swollen.     Right Sinus: No maxillary sinus tenderness or frontal sinus tenderness.     Left Sinus: No maxillary sinus tenderness or frontal sinus tenderness.     Mouth/Throat:     Pharynx: Uvula midline. No posterior oropharyngeal erythema.     Tonsils: 0 on the right. 0 on the left.  Cardiovascular:     Rate and Rhythm: Normal rate and regular rhythm.     Heart sounds: Normal heart sounds.  Pulmonary:     Effort: Pulmonary effort is normal.     Breath sounds: Wheezing present.     Comments: Respiratory effort normal, no obvious shortness of breath. Skin:    General: Skin is warm and dry.  Neurological:     General: No focal deficit present.     Mental Status: He is alert.  Psychiatric:        Behavior: Behavior normal.      Assessment  and Plan   1. Non-recurrent acute suppurative otitis media of right ear without spontaneous rupture of tympanic membrane - amoxicillin (AMOXIL) 500 MG capsule; Take 1 capsule (500 mg total) by mouth 2 (two) times daily for 10 days.  Dispense: 20 capsule; Refill: 0  2. Asthma due to seasonal allergies - fluticasone (FLOVENT HFA) 44 MCG/ACT inhaler; Inhale 2 puffs into the lungs 2 (two) times daily.  Dispense: 1 each; Refill: 1 - montelukast (SINGULAIR) 10 MG tablet; Take 1 tablet (10 mg total) by mouth at bedtime.  Dispense: 30 tablet; Refill: 3 - albuterol (VENTOLIN HFA) 108 (90 Base) MCG/ACT inhaler; Inhale 2 puffs into the lungs every 6 (six) hours as needed for wheezing or shortness of breath.  Dispense: 6.7 g; Refill: 3   Right TM erythematous, will treat with antibiotic twice  per day for 10 days.  Asthma exacerbation due to seasonal allergies, will add fluticasone inhaler 2 puffs twice per day, montelukast daily for asthma/allergic rhinitis.  Albuterol inhaler refills since.  Understands the goal for well-controlled asthma, rescue inhaler no more than twice per week.  Agrees with plan of care discussed today. Understands warning signs to seek further care: chest pain, shortness of breath, any significant change in health.  Understands to follow-up if symptoms worsen, do not improve.  If continues to need albuterol inhaler twice per day, follow-up as soon as possible.    Dorena Bodo, NP 01/25/2021

## 2021-08-07 ENCOUNTER — Telehealth: Payer: Self-pay | Admitting: Family Medicine

## 2021-08-07 NOTE — Telephone Encounter (Signed)
Patient has physical on 12/2 and needing labs

## 2021-08-09 NOTE — Telephone Encounter (Signed)
Per Tommie Sams, DO :  Given his age and lack of comorbidities, I think we can hold off on labs

## 2021-08-09 NOTE — Telephone Encounter (Signed)
Patient notified and verbalized understanding. 

## 2021-08-24 ENCOUNTER — Other Ambulatory Visit: Payer: Self-pay

## 2021-08-24 ENCOUNTER — Ambulatory Visit (INDEPENDENT_AMBULATORY_CARE_PROVIDER_SITE_OTHER): Payer: No Typology Code available for payment source | Admitting: Family Medicine

## 2021-08-24 VITALS — BP 149/106 | HR 98 | Temp 98.8°F | Ht 69.5 in | Wt 173.0 lb

## 2021-08-24 DIAGNOSIS — Z130 Encounter for screening for diseases of the blood and blood-forming organs and certain disorders involving the immune mechanism: Secondary | ICD-10-CM | POA: Diagnosis not present

## 2021-08-24 DIAGNOSIS — R030 Elevated blood-pressure reading, without diagnosis of hypertension: Secondary | ICD-10-CM | POA: Insufficient documentation

## 2021-08-24 DIAGNOSIS — Z13220 Encounter for screening for lipoid disorders: Secondary | ICD-10-CM | POA: Diagnosis not present

## 2021-08-24 DIAGNOSIS — Z0000 Encounter for general adult medical examination without abnormal findings: Secondary | ICD-10-CM | POA: Insufficient documentation

## 2021-08-24 NOTE — Patient Instructions (Signed)
Check BP at home.  If >130/80 consistently let me know.  Follow up annually.  Take care  Dr. Adriana Simas

## 2021-08-24 NOTE — Progress Notes (Signed)
Subjective:  Patient ID: Jeffery Arnold, male    DOB: [birth year]  Age: 20 y.o. MRN: [id number]  CC: Annual exam  HPI Jeffery Arnold is a 20 y.o. male presents to the clinic today for an annual exam.  Patient states that he is doing well.  He recently got engaged.  He states that this is stressful.  He is otherwise feeling well.  Preventative Healthcare Immunizations Tetanus - Due. Pneumococcal - Not indicated. Flu - Declines. Hepatitis C screening - Completed. Labs: Needs labs. Exercise: No. Just related to his work. Alcohol use: Occasional. Smoking/tobacco use: No smoking. Try to quit using dip.  PMH, Surgical Hx, Family Hx, Social History reviewed and updated as below.  Past Medical History:  Diagnosis Date   Asthma    GERD (gastroesophageal reflux disease)    No past surgical history on file. Family History  Problem Relation Age of Onset   Diabetes Other    Social History   Tobacco Use   Smoking status: Former   Smokeless tobacco: Never  Substance Use Topics   Alcohol use: Not on file   Review of Systems  Constitutional: Negative.   Respiratory: Negative.    Cardiovascular: Negative.   Gastrointestinal: Negative.    Objective:   Today's Vitals: BP (!) 149/106   Pulse 98   Temp 98.8 F (37.1 C)   Ht 5' 9.5" (1.765 m)   Wt 173 lb (78.5 kg)   SpO2 100%   BMI 25.18 kg/m   Physical Exam Vitals and nursing note reviewed.  Constitutional:      General: He is not in acute distress.    Appearance: Normal appearance. He is not ill-appearing.  HENT:     Head: Normocephalic and atraumatic.  Eyes:     General:        Right eye: No discharge.        Left eye: No discharge.     Conjunctiva/sclera: Conjunctivae normal.  Cardiovascular:     Rate and Rhythm: Normal rate and regular rhythm.     Heart sounds: No murmur heard. Pulmonary:     Effort: Pulmonary effort is normal.     Breath sounds: Normal breath sounds. No wheezing, rhonchi or rales.   Abdominal:     General: There is no distension.     Palpations: Abdomen is soft.     Tenderness: There is no abdominal tenderness.  Neurological:     General: No focal deficit present.     Mental Status: He is alert.  Psychiatric:        Mood and Affect: Mood normal.        Behavior: Behavior normal.     Assessment & Plan:   Problem List Items Addressed This Visit       Other   Elevated BP without diagnosis of hypertension   Relevant Orders   CMP14+EGFR   Annual physical exam - Primary    We discussed preventative health care today.  This was updated in epic. Screening labs today. BP elevated today and on recheck.  He has no prior history of hypertension.  Advised to check his blood pressures regularly at home and to notify me if his blood pressures are consistently elevated greater than 130/80.      Other Visit Diagnoses     Screening for deficiency anemia       Relevant Orders   CBC   Screening, lipid       Relevant Orders   Lipid panel  Follow-up: Return in about 1 year (around 08/24/2022).  [facility]    ew

## 2021-08-24 NOTE — Assessment & Plan Note (Signed)
We discussed preventative health care today.  This was updated in epic. Screening labs today. BP elevated today and on recheck.  He has no prior history of hypertension.  Advised to check his blood pressures regularly at home and to notify me if his blood pressures are consistently elevated greater than 130/80.

## 2021-08-25 LAB — CBC
Hematocrit: 44.6 % (ref 37.5–51.0)
Hemoglobin: 14.9 g/dL (ref 13.0–17.7)
MCH: 29.6 pg (ref 26.6–33.0)
MCHC: 33.4 g/dL (ref 31.5–35.7)
MCV: 89 fL (ref 79–97)
Platelets: 285 10*3/uL (ref 150–450)
RBC: 5.03 x10E6/uL (ref 4.14–5.80)
RDW: 12.5 % (ref 11.6–15.4)
WBC: 7.6 10*3/uL (ref 3.4–10.8)

## 2021-08-25 LAB — CMP14+EGFR
ALT: 23 IU/L (ref 0–44)
AST: 17 IU/L (ref 0–40)
Albumin/Globulin Ratio: 1.7 (ref 1.2–2.2)
Albumin: 4.9 g/dL (ref 4.1–5.2)
Alkaline Phosphatase: 115 IU/L (ref 44–121)
BUN/Creatinine Ratio: 11 (ref 9–20)
BUN: 10 mg/dL (ref 6–20)
Bilirubin Total: 0.4 mg/dL (ref 0.0–1.2)
CO2: 22 mmol/L (ref 20–29)
Calcium: 9.7 mg/dL (ref 8.7–10.2)
Chloride: 103 mmol/L (ref 96–106)
Creatinine, Ser: 0.9 mg/dL (ref 0.76–1.27)
Globulin, Total: 2.9 g/dL (ref 1.5–4.5)
Glucose: 81 mg/dL (ref 70–99)
Potassium: 4.2 mmol/L (ref 3.5–5.2)
Sodium: 141 mmol/L (ref 134–144)
Total Protein: 7.8 g/dL (ref 6.0–8.5)
eGFR: 124 mL/min/{1.73_m2} (ref >59)

## 2021-08-25 LAB — LIPID PANEL
Chol/HDL Ratio: 3.5 ratio (ref 0.0–5.0)
Cholesterol, Total: 127 mg/dL (ref 100–199)
HDL: 36 mg/dL — ABNORMAL LOW (ref >39)
LDL Chol Calc (NIH): 66 mg/dL (ref 0–99)
Triglycerides: 144 mg/dL (ref 0–149)
VLDL Cholesterol Cal: 25 mg/dL (ref 5–40)

## 2021-09-26 ENCOUNTER — Other Ambulatory Visit: Payer: Self-pay

## 2021-09-26 ENCOUNTER — Ambulatory Visit: Payer: No Typology Code available for payment source | Admitting: Family Medicine

## 2021-09-26 VITALS — BP 138/72 | HR 110 | Temp 98.4°F | Ht 69.5 in | Wt 169.8 lb

## 2021-09-26 DIAGNOSIS — K219 Gastro-esophageal reflux disease without esophagitis: Secondary | ICD-10-CM | POA: Diagnosis not present

## 2021-09-26 DIAGNOSIS — J45909 Unspecified asthma, uncomplicated: Secondary | ICD-10-CM | POA: Diagnosis not present

## 2021-09-26 MED ORDER — ALBUTEROL SULFATE HFA 108 (90 BASE) MCG/ACT IN AERS: 2.0000 | INHALATION_SPRAY | Freq: Four times a day (QID) | RESPIRATORY_TRACT | 3 refills | Status: DC | PRN

## 2021-09-26 MED ORDER — PANTOPRAZOLE SODIUM 40 MG PO TBEC: 40.0000 mg | DELAYED_RELEASE_TABLET | Freq: Every day | ORAL | 1 refills | Status: DC

## 2021-09-26 NOTE — Progress Notes (Signed)
Subjective:  Patient ID: Jeffery Arnold, male    DOB: Feb 05, 2001  Age: 21 y.o. MRN: [id number]  CC: Chief Complaint  Patient presents with   Abdominal Pain    Acid reflux since Sunday Needs refill on Albuterol Inhaler    HPI:  21 year old male presents with the above complaint.  Patient reports that on Tuesday he developed some central abdominal pain.  He subsequently developed reflux/heartburn.  States that he is having heartburn currently.  Recently ate some pizza which likely contributed to his worsening reflux.  He denies any recent dietary changes that would have brought about GERD.  No medications or interventions tried.  Patient also requesting refill on his albuterol.  No other complaints or concerns at this time.  Patient Active Problem List   Diagnosis Date Noted   GERD (gastroesophageal reflux disease) 09/26/2021   Elevated BP without diagnosis of hypertension 08/24/2021   Annual physical exam 08/24/2021   Asthma due to seasonal allergies 08/25/2013    Social Hx   Social History   Socioeconomic History   Marital status: Single    Spouse name: Not on file   Number of children: Not on file   Years of education: Not on file   Highest education level: Not on file  Occupational History   Not on file  Tobacco Use   Smoking status: Former   Smokeless tobacco: Never  Substance and Sexual Activity   Alcohol use: Not on file   Drug use: Not on file   Sexual activity: Not on file  Other Topics Concern   Not on file  Social History Narrative   Not on file   Social Determinants of Health   Financial Resource Strain: Not on file  Food Insecurity: Not on file  Transportation Needs: Not on file  Physical Activity: Not on file  Stress: Not on file  Social Connections: Not on file    Review of Systems Per HPI  Objective:  BP 138/72    Pulse (!) 110    Temp 98.4 F (36.9 C)    Ht 5' 9.5" (1.765 m)    Wt 169 lb 12.8 oz (77 kg)    SpO2 98%    BMI 24.72 kg/m    BP/Weight 09/26/2021 08/24/2021 01/25/2021  Systolic BP 138 149 -  Diastolic BP 72 106 -  Wt. (Lbs) 169.8 173 -  BMI 24.72 25.18 23.17    Physical Exam Vitals and nursing note reviewed.  Constitutional:      General: He is not in acute distress.    Appearance: He is well-developed. He is not ill-appearing.  HENT:     Head: Normocephalic and atraumatic.  Cardiovascular:     Rate and Rhythm: Normal rate and regular rhythm.     Heart sounds: No murmur heard. Pulmonary:     Effort: Pulmonary effort is normal.     Breath sounds: Normal breath sounds. No wheezing, rhonchi or rales.  Abdominal:     General: There is no distension.     Palpations: Abdomen is soft.     Tenderness: There is no abdominal tenderness.  Neurological:     Mental Status: He is alert.    Lab Results  Component Value Date   WBC 7.6 08/24/2021   HGB 14.9 08/24/2021   HCT 44.6 08/24/2021   PLT 285 08/24/2021   GLUCOSE 81 08/24/2021   CHOL 127 08/24/2021   TRIG 144 08/24/2021   HDL 36 (L) 08/24/2021   LDLCALC 66 08/24/2021  ALT 23 08/24/2021   AST 17 08/24/2021   NA 141 08/24/2021   K 4.2 08/24/2021   CL 103 08/24/2021   CREATININE 0.90 08/24/2021   BUN 10 08/24/2021   CO2 22 08/24/2021     Assessment & Plan:   Problem List Items Addressed This Visit       Respiratory   Asthma due to seasonal allergies   Relevant Medications   albuterol (VENTOLIN HFA) 108 (90 Base) MCG/ACT inhaler     Digestive   GERD (gastroesophageal reflux disease) - Primary    New problem. Advised to watch diet. Starting Protonix.      Relevant Medications   pantoprazole (PROTONIX) 40 MG tablet    Meds ordered this encounter  Medications   albuterol (VENTOLIN HFA) 108 (90 Base) MCG/ACT inhaler    Sig: Inhale 2 puffs into the lungs every 6 (six) hours as needed for wheezing or shortness of breath.    Dispense:  18 g    Refill:  3   pantoprazole (PROTONIX) 40 MG tablet    Sig: Take 1 tablet (40 mg total) by  mouth daily.    Dispense:  90 tablet    Refill:  1    Follow-up:  Annually  Everlene Other DO [facility]

## 2021-09-26 NOTE — Assessment & Plan Note (Signed)
New problem. Advised to watch diet. Starting Protonix.

## 2021-10-01 ENCOUNTER — Telehealth: Payer: Self-pay | Admitting: Family Medicine

## 2021-10-01 MED ORDER — DICYCLOMINE HCL 20 MG PO TABS: 20.0000 mg | ORAL_TABLET | Freq: Four times a day (QID) | ORAL | 1 refills | Status: DC | PRN

## 2021-10-01 NOTE — Telephone Encounter (Signed)
Patient was seen 09/26/21 and reflux is better but still having stomach pain.

## 2021-10-01 NOTE — Telephone Encounter (Signed)
Thersa Salt G, DO    Send in Dicyclomine 20 mg; 4 times daily as needed for abdominal pain. # 20; 1 refill.

## 2021-10-01 NOTE — Telephone Encounter (Signed)
Prescription sent electronically to pharmacy. Patient notified. 

## 2021-10-10 ENCOUNTER — Other Ambulatory Visit: Payer: Self-pay | Admitting: Family Medicine

## 2021-10-10 ENCOUNTER — Encounter: Payer: Self-pay | Admitting: Family Medicine

## 2021-10-10 DIAGNOSIS — K219 Gastro-esophageal reflux disease without esophagitis: Secondary | ICD-10-CM

## 2021-10-10 DIAGNOSIS — R109 Unspecified abdominal pain: Secondary | ICD-10-CM

## 2021-10-15 ENCOUNTER — Encounter: Payer: Self-pay | Admitting: Gastroenterology

## 2022-01-01 NOTE — Progress Notes (Signed)
? ?Referring Provider: Coral Spikes, DO ?Primary Care Physician:  Coral Spikes, DO ?Primary Gastroenterologist:  Dr. Abbey Chatters ? ?Chief Complaint  ?Patient presents with  ? Abdominal Pain  ?  Cramps, diarrhea, heart burn   ? ? ?HPI:   ?Jeffery Arnold is a 21 y.o. male presenting today at the request of Coral Spikes, DO for reflux and abdominal pain.  ? ?Reviewed office visit with PCP dated 09/26/2021.  He reported new onset central abdominal pain as well as reflux/heartburn.  No dietary changes that would have brought on GERD.  He was started on Protonix 40 mg daily.  Patient message to PCP on 1/18 reporting Protonix is working well, but still having abdominal pain described as constant pressure and intermittent sharp pain.  PCP recommended GI evaluation. ? ?Today:  ? ?Reflux:  ?Took Protonix daily for about 3 weeks, and symptoms improved.  Now taking Protonix as needed, about once a week.  Limiting caffeine, spicy foods, peppermint. No N/V or dysphagia.  ? ?Abdominal pain:  ?Intermittent lower abdominal pain started in high school.  Described as a pressure/cramping in the lower abdomen but associated with diarrhea and improved with bowel movements.  Previously, symptoms had occurred about once every 6 months, but a few months ago, symptoms flared back up.  Now he gets abdominal pain about once a week and will use dicyclomine as needed which is helpful.  At baseline, he has 1-2 loose bowel movements daily.  When he has lower abdominal pain, he has more trouble with diarrhea.  No specific dietary triggers.  Denies BRBPR, melena, unintentional weight loss, family history of colon cancer or IBD.   ? ?Reports some increased stress as he is wedding planning. ? ? ?Past Medical History:  ?Diagnosis Date  ? Asthma   ? GERD (gastroesophageal reflux disease)   ? ? ?History reviewed. No pertinent surgical history. ? ?Current Outpatient Medications  ?Medication Sig Dispense Refill  ? albuterol (VENTOLIN HFA) 108 (90 Base)  MCG/ACT inhaler Inhale 2 puffs into the lungs every 6 (six) hours as needed for wheezing or shortness of breath. 18 g 3  ? dicyclomine (BENTYL) 20 MG tablet Take 1 tablet (20 mg total) by mouth 4 (four) times daily as needed (abdominal pain). 20 tablet 1  ? rifaximin (XIFAXAN) 550 MG TABS tablet Take 1 tablet (550 mg total) by mouth 3 (three) times daily. 42 tablet 0  ? ?No current facility-administered medications for this visit.  ? ? ?Allergies as of 01/03/2022  ? (No Known Allergies)  ? ? ?Family History  ?Problem Relation Age of Onset  ? Diabetes Other   ? Colon cancer Neg Hx   ? Inflammatory bowel disease Neg Hx   ? ? ?Social History  ? ?Socioeconomic History  ? Marital status: Single  ?  Spouse name: Not on file  ? Number of children: Not on file  ? Years of education: Not on file  ? Highest education level: Not on file  ?Occupational History  ? Not on file  ?Tobacco Use  ? Smoking status: Former  ? Smokeless tobacco: Never  ? Tobacco comments:  ?  Nicotine pouches.   ?Substance and Sexual Activity  ? Alcohol use: Yes  ?  Comment: 4 beer on the weekend  ? Drug use: Never  ? Sexual activity: Not on file  ?Other Topics Concern  ? Not on file  ?Social History Narrative  ? Not on file  ? ?Social Determinants of Health  ? ?  Financial Resource Strain: Not on file  ?Food Insecurity: Not on file  ?Transportation Needs: Not on file  ?Physical Activity: Not on file  ?Stress: Not on file  ?Social Connections: Not on file  ?Intimate Partner Violence: Not on file  ? ? ?Review of Systems: ?Gen: Denies any fever, chills, cold or flulike symptoms, presyncope, syncope.  ?CV: Denies chest pain, heart palpitations.  ?Resp: Denies shortness of breath or cough. ?GI: See HPI ?GU : Denies urinary burning, urinary frequency, urinary hesitancy ?MS: Denies joint pain. ?Derm: Denies rash. ?Psych: Denies depression or anxiety.  ?Heme: See HPI ? ?Physical Exam: ?BP 124/66   Pulse 82   Temp 97.6 ?F (36.4 ?C) (Temporal)   Ht 6' (1.829 m)    Wt 179 lb 6.4 oz (81.4 kg)   BMI 24.33 kg/m?  ?General:   Alert and oriented. Pleasant and cooperative. Well-nourished and well-developed.  ?Head:  Normocephalic and atraumatic. ?Eyes:  Without icterus, sclera clear and conjunctiva pink.  ?Ears:  Normal auditory acuity. ?Lungs:  Clear to auscultation bilaterally. No wheezes, rales, or rhonchi. No distress.  ?Heart:  S1, S2 present without murmurs appreciated.  ?Abdomen:  +BS, soft, non-tender and non-distended. No HSM noted. No guarding or rebound. No masses appreciated.  ?Rectal:  Deferred  ?Msk:  Symmetrical without gross deformities. Normal posture. ?Extremities:  Without edema. ?Neurologic:  Alert and  oriented x4;  grossly normal neurologically. ?Skin:  Intact without significant lesions or rashes. ?Psych: Normal mood and affect. ? ? ? ?Assessment:  ?21 year old male with history of asthma, presenting today at the request of Dr. Lacinda Axon for further evaluation of reflux and abdominal pain.  ? ?Abdominal pain/loose stools:  ?Patient reports intermittent abdominal pain described as pressure/cramping with associated diarrhea/increased loose stools since high school.  Abdominal pain improves with bowel movements.  Previously occurring once every 6 months or so, but has been increasing in frequency, now occurring about once a week using dicyclomine as needed which is helpful. Aside from intermittent flares, he does not have abdominal pain, but has 1-2 loose BMs daily without alarm symptoms. No Fhx of colon cancer or IBD.  Abdominal exam benign today.  No anemia on blood work in December 2022.  He meets criteria for IBS-D and we will try him on a course of Xifaxan. I will also screen him for celiac disease and thyroid abnormalities.  ? ?Reflux:  ?Improved with limiting reflux triggers, now with symptoms about once a week using Protonix as needed.  Recommended continuing to limit reflux triggers and trying to use Pepcid as needed rather than Protonix.  If recurrent  reflux symptoms 2-3 times a week, can start low-dose medication daily. ? ? ? ?Plan:  ?IgA, TTG IgA, TSH ?Xifaxan 550 mg 3 times daily x14 days. ?May continue to use dicyclomine as needed for abdominal cramping or loose stools while waiting for Xifaxan.  Hold in the setting of constipation. ?Stop Protonix. ?Use Pepcid 20 mg as needed for occasional reflux symptoms. ?Reinforced GERD diet/lifestyle.  Separate written instructions provided. ?Requested patient let me know if he develops heartburn symptoms 2 to 3 days a week. ?Follow-up in 3 to 4 months or sooner if needed. ? ? ?Aliene Altes, PA-C ?[facility] ?01/03/2022 ?  ?

## 2022-01-03 ENCOUNTER — Encounter: Payer: Self-pay | Admitting: Gastroenterology

## 2022-01-03 ENCOUNTER — Ambulatory Visit (INDEPENDENT_AMBULATORY_CARE_PROVIDER_SITE_OTHER): Payer: No Typology Code available for payment source | Admitting: Gastroenterology

## 2022-01-03 VITALS — BP 124/66 | HR 82 | Temp 97.6°F | Ht 72.0 in | Wt 179.4 lb

## 2022-01-03 DIAGNOSIS — K580 Irritable bowel syndrome with diarrhea: Secondary | ICD-10-CM | POA: Diagnosis not present

## 2022-01-03 DIAGNOSIS — R1030 Lower abdominal pain, unspecified: Secondary | ICD-10-CM | POA: Insufficient documentation

## 2022-01-03 DIAGNOSIS — R195 Other fecal abnormalities: Secondary | ICD-10-CM | POA: Insufficient documentation

## 2022-01-03 DIAGNOSIS — K219 Gastro-esophageal reflux disease without esophagitis: Secondary | ICD-10-CM

## 2022-01-03 MED ORDER — RIFAXIMIN 550 MG PO TABS
550.0000 mg | ORAL_TABLET | Freq: Two times a day (BID) | ORAL | 0 refills | Status: DC
Start: 2022-01-03 — End: 2022-01-03

## 2022-01-03 MED ORDER — RIFAXIMIN 550 MG PO TABS: 550.0000 mg | ORAL_TABLET | Freq: Three times a day (TID) | ORAL | 0 refills | Status: DC

## 2022-01-03 NOTE — Patient Instructions (Signed)
As we discussed, I suspect your intermittent abdominal pain and loose stools is secondary to irritable bowel syndrome. ? ?I would like for you to have blood work completed at [facility] to screen you for celiac disease and check your thyroid function. ? ?I am sending a prescription for Xifaxan 3 times daily for 14 days to your pharmacy to treat irritable bowel syndrome.  Please let us know if this medication is too expensive. ? ?While you are waiting to obtain Xifaxan, you can continue to use dicyclomine as needed for abdominal cramping or loose stools.  ?Hold dicyclomine if you get constipated. ? ?For occasional heartburn symptoms, try taking Pepcid 20 mg as needed rather than Protonix.  You can pick this medication up over-the-counter. ? ?If you start having heartburn symptoms 2 to 3 days a week, please let me know and we can get you started on an acid reflux medication every day. ? ?It is important that you avoid common reflux triggers including: ?Fried, fatty, greasy, spicy, citrus foods. ?Caffeine and carbonated beverages. ?Chocolate. ? ?Lifestyle recommendations to help prevent reflux include: ?Eating 4-6 small meals a day rather than 3 large meals. ?Do not eat within 3 hours of laying down. ?Prop head of bed up on wood or bricks to create a 6 inch incline. ? ?I will plan to follow-up with you in 3 to 4 months.  Do not hesitate to call if you have any questions or concerns prior to your next visit. ? ?It was nice meeting you today!  Good luck with wedding planning! ? ?Aliene Altes, PA-C ?[facility] ? ? ? ?

## 2022-01-17 ENCOUNTER — Ambulatory Visit (INDEPENDENT_AMBULATORY_CARE_PROVIDER_SITE_OTHER): Payer: No Typology Code available for payment source | Admitting: Family Medicine

## 2022-01-17 ENCOUNTER — Encounter: Payer: Self-pay | Admitting: Family Medicine

## 2022-01-17 VITALS — BP 144/84 | HR 103 | Temp 98.5°F | Ht 72.0 in | Wt 173.0 lb

## 2022-01-17 DIAGNOSIS — J029 Acute pharyngitis, unspecified: Secondary | ICD-10-CM

## 2022-01-17 LAB — POCT RAPID STREP A (OFFICE): Rapid Strep A Screen: NEGATIVE

## 2022-01-17 MED ORDER — AMOXICILLIN 875 MG PO TABS: 875.0000 mg | ORAL_TABLET | Freq: Two times a day (BID) | ORAL | 0 refills | Status: AC

## 2022-01-17 NOTE — Patient Instructions (Signed)
Antibiotic as prescribed.  Call with concerns.  Take care  Dr. Annlee Glandon  

## 2022-01-18 DIAGNOSIS — J029 Acute pharyngitis, unspecified: Secondary | ICD-10-CM | POA: Insufficient documentation

## 2022-01-18 NOTE — Assessment & Plan Note (Addendum)
Acute illness with systemic symptoms given fever.  Rapid strep negative.  Awaiting culture.  Placing on amoxicillin while awaiting culture. ?

## 2022-01-18 NOTE — Progress Notes (Signed)
? ?Subjective:  ?Patient ID: Jeffery Arnold, male    DOB: October 01, 2000  Age: 21 y.o. MRN: [id number] ? ?CC: ?Chief Complaint  ?Patient presents with  ? Fever  ?   body aches, headache, sore throat ?Tyl for fever 100 on tue, wed  ? ? ?HPI: ? ?21 year old male presents for evaluation of the above. ? ?Symptoms started on Tuesday.  He reports fever, headaches, body aches, and sore throat.  Currently afebrile.  However continues to have sore throat and headache.  Fever improved with Tylenol.  No other associated symptoms.  No other complaints or concerns at this time. ? ?Patient Active Problem List  ? Diagnosis Date Noted  ? Sore throat 01/18/2022  ? Irritable bowel syndrome with diarrhea 01/03/2022  ? Gastroesophageal reflux disease 09/26/2021  ? Elevated BP without diagnosis of hypertension 08/24/2021  ? Annual physical exam 08/24/2021  ? Asthma due to seasonal allergies 08/25/2013  ? ? ?Social Hx   ?Social History  ? ?Socioeconomic History  ? Marital status: Single  ?  Spouse name: Not on file  ? Number of children: Not on file  ? Years of education: Not on file  ? Highest education level: Not on file  ?Occupational History  ? Not on file  ?Tobacco Use  ? Smoking status: Former  ? Smokeless tobacco: Never  ? Tobacco comments:  ?  Nicotine pouches.   ?Substance and Sexual Activity  ? Alcohol use: Yes  ?  Comment: 4 beer on the weekend  ? Drug use: Never  ? Sexual activity: Not on file  ?Other Topics Concern  ? Not on file  ?Social History Narrative  ? Not on file  ? ?Social Determinants of Health  ? ?Financial Resource Strain: Not on file  ?Food Insecurity: Not on file  ?Transportation Needs: Not on file  ?Physical Activity: Not on file  ?Stress: Not on file  ?Social Connections: Not on file  ? ? ?Review of Systems ?Per HPI ? ?Objective:  ?BP (!) 144/84   Pulse (!) 103   Temp 98.5 ?F (36.9 ?C)   Ht 6' (1.829 m)   Wt 173 lb (78.5 kg)   SpO2 99%   BMI 23.46 kg/m?  ? ? ?  01/17/2022  ?  4:00 PM 01/03/2022  ?  8:32 AM  09/26/2021  ?  3:31 PM  ?BP/Weight  ?Systolic BP 144 124 138  ?Diastolic BP 84 66 72  ?Wt. (Lbs) 173 179.4 169.8  ?BMI 23.46 kg/m2 24.33 kg/m2 24.72 kg/m2  ? ? ?Physical Exam ?Vitals and nursing note reviewed.  ?Constitutional:   ?   General: He is not in acute distress. ?   Appearance: Normal appearance.  ?HENT:  ?   Head: Normocephalic and atraumatic.  ?   Right Ear: Tympanic membrane normal.  ?   Left Ear: Tympanic membrane normal.  ?   Mouth/Throat:  ?   Pharynx: Posterior oropharyngeal erythema present.  ?Eyes:  ?   General:     ?   Right eye: No discharge.     ?   Left eye: No discharge.  ?   Conjunctiva/sclera: Conjunctivae normal.  ?Cardiovascular:  ?   Rate and Rhythm: Normal rate and regular rhythm.  ?   Heart sounds: No murmur heard. ?Pulmonary:  ?   Effort: Pulmonary effort is normal.  ?   Breath sounds: Normal breath sounds. No wheezing, rhonchi or rales.  ?Neurological:  ?   Mental Status: He is alert.  ?Psychiatric:     ?  Mood and Affect: Mood normal.     ?   Behavior: Behavior normal.  ? ? ?Lab Results  ?Component Value Date  ? WBC 7.6 08/24/2021  ? HGB 14.9 08/24/2021  ? HCT 44.6 08/24/2021  ? PLT 285 08/24/2021  ? GLUCOSE 81 08/24/2021  ? CHOL 127 08/24/2021  ? TRIG 144 08/24/2021  ? HDL 36 (L) 08/24/2021  ? LDLCALC 66 08/24/2021  ? ALT 23 08/24/2021  ? AST 17 08/24/2021  ? NA 141 08/24/2021  ? K 4.2 08/24/2021  ? CL 103 08/24/2021  ? CREATININE 0.90 08/24/2021  ? BUN 10 08/24/2021  ? CO2 22 08/24/2021  ? ? ? ?Assessment & Plan:  ? ?Problem List Items Addressed This Visit   ? ?  ? Other  ? Sore throat - Primary  ?  Acute illness with systemic symptoms given fever.  Rapid strep negative.  Awaiting culture.  Placing on amoxicillin while awaiting culture. ? ?  ?  ? Relevant Orders  ? POCT rapid strep A (Completed)  ? Culture, Group A Strep  ? ? ?Meds ordered this encounter  ?Medications  ? amoxicillin (AMOXIL) 875 MG tablet  ?  Sig: Take 1 tablet (875 mg total) by mouth 2 (two) times daily for 10  days.  ?  Dispense:  20 tablet  ?  Refill:  0  ? ?Everlene Other DO ?[facility] ? ?

## 2022-01-20 LAB — CULTURE, GROUP A STREP: Strep A Culture: NEGATIVE

## 2022-01-20 LAB — SPECIMEN STATUS REPORT

## 2022-04-11 ENCOUNTER — Ambulatory Visit: Payer: No Typology Code available for payment source | Admitting: Gastroenterology

## 2022-04-14 NOTE — Progress Notes (Signed)
Referring Provider: Tommie Sams, DO Primary Care Physician:  Tommie Sams, DO Primary GI Physician: Dr. Marletta Lor  Chief Complaint  Patient presents with   Follow-up    HPI:   Jeffery Arnold is a 21 y.o. male presenting today for follow-up of reflux, lower abdominal pain and loose stools.  Last seen in our office at the time of initial consult on 01/03/2022.  Patient reported history of intermittent abdominal pain described as pressure/cramping with associated diarrhea/increased loose stools since high school.  Abdominal pain improved by bowel movement.  Symptoms previously occurring once every 6 months, but now once a week using dicyclomine as needed which was helpful.  Aside from intermittent flares, no abdominal pain, but has 1-2 loose bowel movements daily without alarm symptoms.  No family history of colon cancer or IBD.  Abdominal exam was benign.  No anemia on blood work in December 2022.  Suspected IBS-D and planned to complete course of Xifaxan.  Also plan to screen for celiac disease and thyroid abnormalities.  In addition, he reported reflux symptoms that have improved with limiting dietary triggers, now with symptoms about once a week using Protonix as needed.  Recommended continuing to limit reflux triggers, use Pepcid rather than Protonix as needed, and monitor.  Labs were not completed.  Today: Reports he feels much better after taking a course of Xifaxan.  Very rarely does he have breakthrough loose stools or abdominal discomfort for which she will use dicyclomine as needed.  Maybe once every 2 weeks and usually caused by eating something that does not agree with him such as spicy foods or cheese and rice from a [employer].  Denies BRBPR or melena.  Heartburn:  Very seldom. No nausea, vomiting, or dysphagia.  Using Protonix as needed which is prescribed by PCP.  Past Medical History:  Diagnosis Date   Asthma    GERD (gastroesophageal reflux disease)      History reviewed. No pertinent surgical history.  Current Outpatient Medications  Medication Sig Dispense Refill   albuterol (VENTOLIN HFA) 108 (90 Base) MCG/ACT inhaler Inhale 2 puffs into the lungs every 6 (six) hours as needed for wheezing or shortness of breath. 18 g 3   dicyclomine (BENTYL) 10 MG capsule Take 1 capsule (10 mg total) by mouth 4 (four) times daily -  before meals and at bedtime. As needed for abdominal cramping or loose stools 120 capsule 1   pantoprazole (PROTONIX) 20 MG tablet Take 20 mg by mouth daily.     No current facility-administered medications for this visit.    Allergies as of 04/15/2022   (No Known Allergies)    Family History  Problem Relation Age of Onset   Diabetes Other    Colon cancer Neg Hx    Inflammatory bowel disease Neg Hx     Social History   Socioeconomic History   Marital status: Single    Spouse name: Not on file   Number of children: Not on file   Years of education: Not on file   Highest education level: Not on file  Occupational History   Not on file  Tobacco Use   Smoking status: Former   Smokeless tobacco: Never   Tobacco comments:    Nicotine pouches.   Substance and Sexual Activity   Alcohol use: Yes    Comment: 4 beer on the weekend   Drug use: Never   Sexual activity: Not on file  Other Topics Concern   Not  on file  Social History Narrative   Not on file   Social Determinants of Health   Financial Resource Strain: Not on file  Food Insecurity: Not on file  Transportation Needs: Not on file  Physical Activity: Not on file  Stress: Not on file  Social Connections: Not on file    Review of Systems: Gen: Denies fever, chills, cold or flulike symptoms, presyncope, syncope. CV: Denies chest pain, palpitations. Resp: Denies dyspnea, cough. GI: See HPI Heme: See HPI  Physical Exam: BP (!) 148/81 (BP Location: Right Arm, Patient Position: Sitting, Cuff Size: Normal)   Pulse (!) 103   Temp 97.7 F  (36.5 C) (Temporal)   Ht 6' (1.829 m)   Wt 181 lb (82.1 kg)   BMI 24.55 kg/m  General:   Alert and oriented. No distress noted. Pleasant and cooperative.  Head:  Normocephalic and atraumatic. Eyes:  Conjuctiva clear without scleral icterus. Heart:  S1, S2 present without murmurs appreciated. Lungs:  Clear to auscultation bilaterally. No wheezes, rales, or rhonchi. No distress.  Abdomen:  +BS, soft, non-tender and non-distended. No rebound or guarding. No HSM or masses noted. Msk:  Symmetrical without gross deformities. Normal posture. Extremities:  Without edema. Neurologic:  Alert and  oriented x4 Psych:  Normal mood and affect.    Assessment:  21 year old male with history of heartburn, asthma, chronic intermittent loose stools/abdominal cramping thought to be secondary to IBS, presenting today for follow-up.  Chronic loose stools/abdominal cramping: Likely secondary to IBS-D.  Symptoms now much improved after course of Xifaxan.  Only requiring dicyclomine about once every 2 weeks which is usually after eating something that does not agree with him such as spicy foods or cheese and rice at a [employer].  No alarm symptoms.  Previously recommended labs to screen for celiac disease and checking thyroid function which were not completed, and patient would like to go ahead and complete these at this time.  New orders placed.  He will continue to use dicyclomine as needed and avoid known dietary triggers.  Can consider retreating with Xifaxan if recurrence of frequent symptoms.  Heartburn: Infrequent.  No alarm symptoms.  Currently taking Protonix 20 mg as needed which is prescribed by PCP.   Plan:  TSH, TTG IgA, IgA total Use dicyclomine 10 mg up to 3 times daily before meals and at bedtime for abdominal cramping or loose stools. Continue to follow with PCP for management of heartburn. Follow-up in 1 year or sooner if needed.   Ermalinda Memos, PA-C [facility] 04/15/2022

## 2022-04-15 ENCOUNTER — Encounter: Payer: Self-pay | Admitting: Gastroenterology

## 2022-04-15 ENCOUNTER — Ambulatory Visit (INDEPENDENT_AMBULATORY_CARE_PROVIDER_SITE_OTHER): Payer: No Typology Code available for payment source | Admitting: Gastroenterology

## 2022-04-15 VITALS — BP 148/81 | HR 103 | Temp 97.7°F | Ht 72.0 in | Wt 181.0 lb

## 2022-04-15 DIAGNOSIS — K580 Irritable bowel syndrome with diarrhea: Secondary | ICD-10-CM | POA: Diagnosis not present

## 2022-04-15 DIAGNOSIS — R195 Other fecal abnormalities: Secondary | ICD-10-CM | POA: Diagnosis not present

## 2022-04-15 DIAGNOSIS — R12 Heartburn: Secondary | ICD-10-CM | POA: Insufficient documentation

## 2022-04-15 MED ORDER — DICYCLOMINE HCL 10 MG PO CAPS: 10.0000 mg | ORAL_CAPSULE | Freq: Three times a day (TID) | ORAL | 1 refills | Status: DC

## 2022-04-15 NOTE — Patient Instructions (Signed)
Have blood work completed at [facility].  Continue to use dicyclomine as needed up to 3 times a day before meals and at bedtime for occasional loose stools or abdominal cramping.  Continue to avoid known dietary triggers of abdominal cramping/loose stools.  We will follow-up with you in 1 year or sooner if needed.  Do not hesitate to call if you have any questions or concerns.  It was great to see you again today!  I am glad you are doing well overall!  Enjoy your trip to [facility]!  Ermalinda Memos, PA-C [facility]

## 2022-09-10 ENCOUNTER — Ambulatory Visit: Payer: No Typology Code available for payment source | Admitting: Family Medicine

## 2022-09-10 VITALS — BP 126/70 | HR 95 | Temp 98.1°F | Ht 72.05 in | Wt 182.6 lb

## 2022-09-10 DIAGNOSIS — Z13220 Encounter for screening for lipoid disorders: Secondary | ICD-10-CM

## 2022-09-10 DIAGNOSIS — Z0000 Encounter for general adult medical examination without abnormal findings: Secondary | ICD-10-CM

## 2022-09-10 DIAGNOSIS — Z130 Encounter for screening for diseases of the blood and blood-forming organs and certain disorders involving the immune mechanism: Secondary | ICD-10-CM | POA: Diagnosis not present

## 2022-09-10 DIAGNOSIS — J45909 Unspecified asthma, uncomplicated: Secondary | ICD-10-CM | POA: Diagnosis not present

## 2022-09-10 DIAGNOSIS — K580 Irritable bowel syndrome with diarrhea: Secondary | ICD-10-CM

## 2022-09-10 MED ORDER — ALBUTEROL SULFATE HFA 108 (90 BASE) MCG/ACT IN AERS: 2.0000 | INHALATION_SPRAY | Freq: Four times a day (QID) | RESPIRATORY_TRACT | 3 refills | Status: DC | PRN

## 2022-09-10 NOTE — Progress Notes (Signed)
Subjective:  Patient ID: NERI SAMEK, male    DOB: [birth year]  Age: 21 y.o. MRN: [id number]  CC: Chief Complaint  Patient presents with   lab work    HPI:  21 year old male presents for physical and lab work.  He states that his job requires this.  Last physical was in February of last year.  Patient states that he is doing well.  His GI symptoms are fairly well-controlled at this time.  Asthma is well-controlled.  He does need a refill on his albuterol.  Regarding his preventative health care, he needs laboratory studies.  Declines flu vaccine.  Declines HIV screening.  Last tetanus was in 2012.  He has never had an HPV vaccine.  Patient Active Problem List   Diagnosis Date Noted   Irritable bowel syndrome with diarrhea 01/03/2022   Gastroesophageal reflux disease 09/26/2021   Annual physical exam 08/24/2021   Asthma due to seasonal allergies 08/25/2013    Social Hx   Social History   Socioeconomic History   Marital status: Single    Spouse name: Not on file   Number of children: Not on file   Years of education: Not on file   Highest education level: Not on file  Occupational History   Not on file  Tobacco Use   Smoking status: Former   Smokeless tobacco: Never   Tobacco comments:    Nicotine pouches.   Substance and Sexual Activity   Alcohol use: Yes    Comment: 4 beer on the weekend   Drug use: Never   Sexual activity: Not on file  Other Topics Concern   Not on file  Social History Narrative   Not on file   Social Determinants of Health   Financial Resource Strain: Not on file  Food Insecurity: Not on file  Transportation Needs: Not on file  Physical Activity: Not on file  Stress: Not on file  Social Connections: Not on file    Review of Systems  Constitutional: Negative.   Respiratory: Negative.    Cardiovascular: Negative.    Objective:  BP 126/70   Pulse 95   Temp 98.1 F (36.7 C) (Oral)   Ht 6' 0.05" (1.83 m)   Wt 182 lb 9.6 oz  (82.8 kg)   SpO2 96%   BMI 24.73 kg/m      09/10/2022    9:23 AM 04/15/2022    3:31 PM 01/17/2022    4:00 PM  BP/Weight  Systolic BP 754 492 010  Diastolic BP 70 81 84  Wt. (Lbs) 182.6 181 173  BMI 24.73 kg/m2 24.55 kg/m2 23.46 kg/m2    Physical Exam Vitals and nursing note reviewed.  Constitutional:      General: He is not in acute distress.    Appearance: Normal appearance.  HENT:     Head: Normocephalic and atraumatic.  Eyes:     General:        Right eye: No discharge.        Left eye: No discharge.     Conjunctiva/sclera: Conjunctivae normal.  Cardiovascular:     Rate and Rhythm: Normal rate and regular rhythm.  Pulmonary:     Effort: Pulmonary effort is normal.     Breath sounds: Normal breath sounds. No wheezing, rhonchi or rales.  Neurological:     Mental Status: He is alert.  Psychiatric:        Mood and Affect: Mood normal.        Behavior: Behavior normal.  Lab Results  Component Value Date   WBC 7.6 08/24/2021   HGB 14.9 08/24/2021   HCT 44.6 08/24/2021   PLT 285 08/24/2021   GLUCOSE 81 08/24/2021   CHOL 127 08/24/2021   TRIG 144 08/24/2021   HDL 36 (L) 08/24/2021   LDLCALC 66 08/24/2021   ALT 23 08/24/2021   AST 17 08/24/2021   NA 141 08/24/2021   K 4.2 08/24/2021   CL 103 08/24/2021   CREATININE 0.90 08/24/2021   BUN 10 08/24/2021   CO2 22 08/24/2021     Assessment & Plan:   Problem List Items Addressed This Visit       Respiratory   Asthma due to seasonal allergies   Relevant Medications   albuterol (VENTOLIN HFA) 108 (90 Base) MCG/ACT inhaler     Digestive   Irritable bowel syndrome with diarrhea   Relevant Orders   CMP14+EGFR     Other   Annual physical exam - Primary    Doing well.  Declines HIV screening.  Declines flu vaccine.  Labs today.      Other Visit Diagnoses     Screening for deficiency anemia       Relevant Orders   CBC   Screening, lipid       Relevant Orders   Lipid panel       Meds  ordered this encounter  Medications   albuterol (VENTOLIN HFA) 108 (90 Base) MCG/ACT inhaler    Sig: Inhale 2 puffs into the lungs every 6 (six) hours as needed for wheezing or shortness of breath.    Dispense:  18 g    Refill:  3    Follow-up:  Return in about 1 year (around 09/11/2023).  Eunice

## 2022-09-10 NOTE — Assessment & Plan Note (Signed)
Doing well.  Declines HIV screening.  Declines flu vaccine.  Labs today.

## 2022-09-10 NOTE — Patient Instructions (Signed)
Inhaler refilled.  Labs today.  Follow up annually.  Take care  Dr. Adriana Simas

## 2022-09-11 LAB — CBC
Hematocrit: 43.5 % (ref 37.5–51.0)
Hemoglobin: 15 g/dL (ref 13.0–17.7)
MCH: 29.6 pg (ref 26.6–33.0)
MCHC: 34.5 g/dL (ref 31.5–35.7)
MCV: 86 fL (ref 79–97)
Platelets: 305 10*3/uL (ref 150–450)
RBC: 5.07 x10E6/uL (ref 4.14–5.80)
RDW: 11.9 % (ref 11.6–15.4)
WBC: 6.1 10*3/uL (ref 3.4–10.8)

## 2022-09-11 LAB — CMP14+EGFR
ALT: 31 IU/L (ref 0–44)
AST: 19 IU/L (ref 0–40)
Albumin/Globulin Ratio: 1.8 (ref 1.2–2.2)
Albumin: 4.8 g/dL (ref 4.3–5.2)
Alkaline Phosphatase: 124 IU/L — ABNORMAL HIGH (ref 44–121)
BUN/Creatinine Ratio: 10 (ref 9–20)
BUN: 10 mg/dL (ref 6–20)
Bilirubin Total: 0.7 mg/dL (ref 0.0–1.2)
CO2: 21 mmol/L (ref 20–29)
Calcium: 10 mg/dL (ref 8.7–10.2)
Chloride: 102 mmol/L (ref 96–106)
Creatinine, Ser: 0.97 mg/dL (ref 0.76–1.27)
Globulin, Total: 2.6 g/dL (ref 1.5–4.5)
Glucose: 96 mg/dL (ref 70–99)
Potassium: 4.1 mmol/L (ref 3.5–5.2)
Sodium: 139 mmol/L (ref 134–144)
Total Protein: 7.4 g/dL (ref 6.0–8.5)
eGFR: 112 mL/min/{1.73_m2} (ref >59)

## 2022-09-11 LAB — LIPID PANEL
Chol/HDL Ratio: 4.2 ratio (ref 0.0–5.0)
Cholesterol, Total: 171 mg/dL (ref 100–199)
HDL: 41 mg/dL (ref >39)
LDL Chol Calc (NIH): 115 mg/dL — ABNORMAL HIGH (ref 0–99)
Triglycerides: 82 mg/dL (ref 0–149)
VLDL Cholesterol Cal: 15 mg/dL (ref 5–40)

## 2022-10-01 ENCOUNTER — Ambulatory Visit: Payer: No Typology Code available for payment source | Admitting: Family Medicine

## 2022-10-02 ENCOUNTER — Ambulatory Visit: Payer: No Typology Code available for payment source | Admitting: Family Medicine

## 2022-10-02 VITALS — BP 115/73 | HR 78 | Temp 99.1°F | Wt 180.6 lb

## 2022-10-02 DIAGNOSIS — J4521 Mild intermittent asthma with (acute) exacerbation: Secondary | ICD-10-CM | POA: Diagnosis not present

## 2022-10-02 MED ORDER — PREDNISONE 50 MG PO TABS: ORAL_TABLET | ORAL | 0 refills | Status: DC

## 2022-10-02 MED ORDER — AMOXICILLIN-POT CLAVULANATE 875-125 MG PO TABS: 1.0000 | ORAL_TABLET | Freq: Two times a day (BID) | ORAL | 0 refills | Status: DC

## 2022-10-02 NOTE — Progress Notes (Signed)
Subjective:  Patient ID: Jeffery Arnold, male    DOB: 11/29/2000  Age: 22 y.o. MRN: [id number]  CC: Chief Complaint  Patient presents with   Cough    Productive color clear to green, X 6 days Saturday went to urgent care tested for Flu and Covid test was negative.   Headache   chest congestion    HPI:  22 year old male presents for evaluation of the above.  Patient has a history of asthma.  He reports that he has been sick for the past 6 days.  He was recently seen at urgent care and COVID and flu testing was negative.  He continues to have symptoms.  He reports headache, cough, associated chest discomfort and rib discomfort.  Also reports significant fatigue.  Cough is productive of clear to green sputum.  No fever.  He has been using his albuterol at least twice a day.  He reports some nasal congestion as well.  No relieving factors.  Patient Active Problem List   Diagnosis Date Noted   Irritable bowel syndrome with diarrhea 01/03/2022   Gastroesophageal reflux disease 09/26/2021   Annual physical exam 08/24/2021   Asthma exacerbation 08/25/2013    Social Hx   Social History   Socioeconomic History   Marital status: Single    Spouse name: Not on file   Number of children: Not on file   Years of education: Not on file   Highest education level: Not on file  Occupational History   Not on file  Tobacco Use   Smoking status: Former   Smokeless tobacco: Never   Tobacco comments:    Nicotine pouches.   Substance and Sexual Activity   Alcohol use: Yes    Comment: 4 beer on the weekend   Drug use: Never   Sexual activity: Not on file  Other Topics Concern   Not on file  Social History Narrative   Not on file   Social Determinants of Health   Financial Resource Strain: Not on file  Food Insecurity: Not on file  Transportation Needs: Not on file  Physical Activity: Not on file  Stress: Not on file  Social Connections: Not on file    Review of Systems Per  HPI  Objective:  BP 115/73   Pulse 78   Temp 99.1 F (37.3 C) (Temporal)   Wt 180 lb 9.6 oz (81.9 kg)   SpO2 99%   BMI 24.46 kg/m      10/02/2022    9:52 AM 09/10/2022    9:23 AM 04/15/2022    3:31 PM  BP/Weight  Systolic BP 284 132 440  Diastolic BP 73 70 81  Wt. (Lbs) 180.6 182.6 181  BMI 24.46 kg/m2 24.73 kg/m2 24.55 kg/m2    Physical Exam Vitals and nursing note reviewed.  Constitutional:      General: He is not in acute distress.    Appearance: Normal appearance.  HENT:     Head: Normocephalic and atraumatic.     Right Ear: Tympanic membrane normal.     Left Ear: Tympanic membrane normal.     Nose: Nose normal.     Mouth/Throat:     Pharynx: Posterior oropharyngeal erythema present.  Eyes:     General:        Right eye: No discharge.        Left eye: No discharge.     Conjunctiva/sclera: Conjunctivae normal.  Cardiovascular:     Rate and Rhythm: Normal rate and regular rhythm.  Pulmonary:     Effort: Pulmonary effort is normal.     Breath sounds: No wheezing, rhonchi or rales.  Neurological:     Mental Status: He is alert.  Psychiatric:        Mood and Affect: Mood normal.        Behavior: Behavior normal.     Lab Results  Component Value Date   WBC 6.1 09/10/2022   HGB 15.0 09/10/2022   HCT 43.5 09/10/2022   PLT 305 09/10/2022   GLUCOSE 96 09/10/2022   CHOL 171 09/10/2022   TRIG 82 09/10/2022   HDL 41 09/10/2022   LDLCALC 115 (H) 09/10/2022   ALT 31 09/10/2022   AST 19 09/10/2022   NA 139 09/10/2022   K 4.1 09/10/2022   CL 102 09/10/2022   CREATININE 0.97 09/10/2022   BUN 10 09/10/2022   CO2 21 09/10/2022     Assessment & Plan:   Problem List Items Addressed This Visit       Respiratory   Asthma exacerbation - Primary    Treating with Augmentin and prednisone.      Relevant Medications   predniSONE (DELTASONE) 50 MG tablet    Meds ordered this encounter  Medications   predniSONE (DELTASONE) 50 MG tablet    Sig: 1 tablet  daily x 5 days    Dispense:  5 tablet    Refill:  0   amoxicillin-clavulanate (AUGMENTIN) 875-125 MG tablet    Sig: Take 1 tablet by mouth 2 (two) times daily.    Dispense:  14 tablet    Refill:  [place]

## 2022-10-02 NOTE — Patient Instructions (Signed)
Medications as prescribed.  Rest.  Lots of fluids.  Take care  Dr. Lacinda Axon

## 2022-10-02 NOTE — Assessment & Plan Note (Signed)
Treating with Augmentin and prednisone.

## 2022-10-20 ENCOUNTER — Other Ambulatory Visit: Payer: Self-pay | Admitting: Family Medicine

## 2022-10-21 ENCOUNTER — Other Ambulatory Visit: Payer: Self-pay | Admitting: Family Medicine

## 2022-10-21 ENCOUNTER — Encounter: Payer: Self-pay | Admitting: Family Medicine

## 2022-10-21 MED ORDER — BUDESONIDE-FORMOTEROL FUMARATE 160-4.5 MCG/ACT IN AERO: 2.0000 | INHALATION_SPRAY | Freq: Two times a day (BID) | RESPIRATORY_TRACT | 3 refills | Status: DC

## 2022-10-24 NOTE — Telephone Encounter (Signed)
Coral Spikes, DO     Recommend urgent care evaluation.

## 2023-01-09 ENCOUNTER — Other Ambulatory Visit: Payer: Self-pay | Admitting: Family Medicine

## 2023-01-09 DIAGNOSIS — J45909 Unspecified asthma, uncomplicated: Secondary | ICD-10-CM

## 2023-03-05 ENCOUNTER — Encounter: Payer: Self-pay | Admitting: Gastroenterology

## 2023-06-25 ENCOUNTER — Encounter: Payer: Self-pay | Admitting: Family Medicine

## 2023-06-25 DIAGNOSIS — H52223 Regular astigmatism, bilateral: Secondary | ICD-10-CM | POA: Diagnosis not present

## 2023-06-25 DIAGNOSIS — H5213 Myopia, bilateral: Secondary | ICD-10-CM | POA: Diagnosis not present

## 2023-06-26 NOTE — Telephone Encounter (Signed)
Jeffery Sams, DO     Please inform him that there is no record of migraine in the chart. I am therefore unable to fill out his request.

## 2023-06-27 NOTE — Telephone Encounter (Signed)
Everlene Other G, DO     Please advise him that he needs a visit and will need to bring in a form regarding this

## 2023-07-28 NOTE — Progress Notes (Unsigned)
Referring Provider: Tommie Sams, DO Primary Care Physician:  Tommie Sams, DO Primary GI Physician: Dr. Marletta Lor  Chief Complaint  Patient presents with   Follow-up    Follow up. No problems    HPI:   Jeffery Arnold is a 22 y.o. male with history of heartburn, asthma, chronic intermittent loose stools/abdominal cramping thought to be secondary to IBS, presenting today for follow-up.   Last seen in the office 04/15/22. Reported IBS symptoms much improved after completing a course of Xifaxan. Only requiring dicyclomine about once every 2 weeks which is usually after eating something that does not agree with him such as spicy foods or cheese and rice at a [employer]. Heartburn was infrequent and responded to Protonix 20 mg prn, prescribed by PCP. Planned to check TSH, celiac screen as previously ordered and continue dicyclomine prn.   Today: Doing well overall.  States he cut out caffeine which he found out was a trigger reflux.  He has no longer needed pantoprazole.  He has also been doing very well from an IBS standpoint.  Only having very rare flares of abdominal cramping or loose stools, typically related to known dietary triggers as previously described.  He would like to continue to keep dicyclomine on hand as needed.  He is not having any rectal bleeding or black stools.  No other significant GI concerns.  Past Medical History:  Diagnosis Date   Asthma    GERD (gastroesophageal reflux disease)    IBS (irritable bowel syndrome)    with diarrhea, improved with xifaxan in 2023    History reviewed. No pertinent surgical history.  Current Outpatient Medications  Medication Sig Dispense Refill   albuterol (VENTOLIN HFA) 108 (90 Base) MCG/ACT inhaler TAKE 2 PUFFS BY MOUTH EVERY 6 HOURS AS NEEDED FOR WHEEZE OR SHORTNESS OF BREATH 18 each 1   budesonide-formoterol (SYMBICORT) 160-4.5 MCG/ACT inhaler Inhale 2 puffs into the lungs 2 (two) times daily. 1 each 3   dicyclomine  (BENTYL) 10 MG capsule Take 1 capsule (10 mg total) by mouth as needed (For abdominal cramping or loose stools). 60 capsule 2   No current facility-administered medications for this visit.    Allergies as of 07/30/2023   (No Known Allergies)    Family History  Problem Relation Age of Onset   Diabetes Other    Colon cancer Neg Hx    Inflammatory bowel disease Neg Hx     Social History   Socioeconomic History   Marital status: Single    Spouse name: Not on file   Number of children: Not on file   Years of education: Not on file   Highest education level: Not on file  Occupational History   Not on file  Tobacco Use   Smoking status: Former   Smokeless tobacco: Never   Tobacco comments:    Nicotine pouches.   Substance and Sexual Activity   Alcohol use: Yes    Comment: 4 beer on the weekend   Drug use: Never   Sexual activity: Not on file  Other Topics Concern   Not on file  Social History Narrative   Not on file   Social Determinants of Health   Financial Resource Strain: Not on file  Food Insecurity: Not on file  Transportation Needs: Not on file  Physical Activity: Not on file  Stress: Not on file  Social Connections: Not on file    Review of Systems: Gen: Denies fever, chills, cold or  flulike symptoms, presyncope, syncope. GI: See HPI Heme: See HPI  Physical Exam: BP 136/79 (BP Location: Right Arm, Patient Position: Sitting, Cuff Size: Normal)   Pulse 90   Temp 97.9 F (36.6 C) (Temporal)   Ht 6' (1.829 m)   Wt 188 lb 6.4 oz (85.5 kg)   BMI 25.55 kg/m  General:   Alert and oriented. No distress noted. Pleasant and cooperative.  Head:  Normocephalic and atraumatic. Abdomen:  +BS, soft, non-tender and non-distended. No rebound or guarding. No HSM or masses noted. Msk:  Symmetrical without gross deformities. Normal posture. Neurologic:  Alert and  oriented x4 Psych:  Normal mood and affect.    Assessment:  22 y.o. male with history of heartburn,  asthma, IBS-D, presenting today for 1 year follow-up of IBS.  IBS-D:  Symptoms significantly improved last year after a course of Xifaxan.  Continues to use dicyclomine as needed if consuming a known dietary trigger, but overall this is very rare.  No alarm symptoms.   Plan:  Continue dicyclomine 10 mg as needed. Follow-up in 1 year or sooner if needed.   Ermalinda Memos, PA-C [facility] 07/30/2023

## 2023-07-30 ENCOUNTER — Encounter: Payer: Self-pay | Admitting: Gastroenterology

## 2023-07-30 ENCOUNTER — Ambulatory Visit (INDEPENDENT_AMBULATORY_CARE_PROVIDER_SITE_OTHER): Payer: Self-pay | Admitting: Gastroenterology

## 2023-07-30 DIAGNOSIS — K580 Irritable bowel syndrome with diarrhea: Secondary | ICD-10-CM

## 2023-07-30 MED ORDER — DICYCLOMINE HCL 10 MG PO CAPS: 10.0000 mg | ORAL_CAPSULE | ORAL | 2 refills | Status: DC | PRN

## 2023-07-30 NOTE — Patient Instructions (Addendum)
Continue to use dicyclomine as needed.  I have sent a refill to [facility] in [place].  I will plan to see back in the office in 1 year or sooner if needed.  It was good to see you again today!  Have a Happy Thanksgiving and Altamese Cabal Christmas!  Ermalinda Memos, PA-C [facility]

## 2023-09-22 DIAGNOSIS — R030 Elevated blood-pressure reading, without diagnosis of hypertension: Secondary | ICD-10-CM | POA: Diagnosis not present

## 2023-09-22 DIAGNOSIS — R0981 Nasal congestion: Secondary | ICD-10-CM | POA: Diagnosis not present

## 2023-09-22 DIAGNOSIS — Z6826 Body mass index (BMI) 26.0-26.9, adult: Secondary | ICD-10-CM | POA: Diagnosis not present

## 2023-09-22 DIAGNOSIS — R509 Fever, unspecified: Secondary | ICD-10-CM | POA: Diagnosis not present

## 2023-10-03 ENCOUNTER — Ambulatory Visit: Payer: BC Managed Care – PPO | Admitting: Family Medicine

## 2023-10-03 VITALS — BP 146/76 | HR 99 | Temp 98.6°F | Ht 72.0 in | Wt 191.0 lb

## 2023-10-03 DIAGNOSIS — J45909 Unspecified asthma, uncomplicated: Secondary | ICD-10-CM | POA: Diagnosis not present

## 2023-10-03 DIAGNOSIS — J40 Bronchitis, not specified as acute or chronic: Secondary | ICD-10-CM

## 2023-10-03 MED ORDER — PREDNISONE 50 MG PO TABS: 50.0000 mg | ORAL_TABLET | Freq: Every day | ORAL | 0 refills | Status: AC

## 2023-10-03 MED ORDER — ALBUTEROL SULFATE HFA 108 (90 BASE) MCG/ACT IN AERS: 1.0000 | INHALATION_SPRAY | Freq: Four times a day (QID) | RESPIRATORY_TRACT | 1 refills | Status: DC | PRN

## 2023-10-03 MED ORDER — BUDESONIDE-FORMOTEROL FUMARATE 160-4.5 MCG/ACT IN AERO: 2.0000 | INHALATION_SPRAY | Freq: Two times a day (BID) | RESPIRATORY_TRACT | 3 refills | Status: AC

## 2023-10-03 MED ORDER — DOXYCYCLINE HYCLATE 100 MG PO TABS: 100.0000 mg | ORAL_TABLET | Freq: Two times a day (BID) | ORAL | 0 refills | Status: DC

## 2023-10-03 MED ORDER — PROMETHAZINE-DM 6.25-15 MG/5ML PO SYRP: 5.0000 mL | ORAL_SOLUTION | Freq: Four times a day (QID) | ORAL | 0 refills | Status: DC | PRN

## 2023-10-03 NOTE — Patient Instructions (Signed)
 Medications as directed.  Message with concerns.

## 2023-10-05 DIAGNOSIS — J40 Bronchitis, not specified as acute or chronic: Secondary | ICD-10-CM | POA: Insufficient documentation

## 2023-10-05 NOTE — Assessment & Plan Note (Signed)
 Treating with Doxy, Promethazine DM, and prednisone.  I have refilled his asthma medications.

## 2023-10-05 NOTE — Progress Notes (Signed)
 Subjective:  Patient ID: Jeffery Arnold, male    DOB: 2001/04/01  Age: 23 y.o. MRN: [id number]  CC: Respiratory symptoms   HPI:  22 year old male history of asthma presents for evaluation of respiratory symptoms.  Patient reports ongoing cough since Christmas.  He states that he was previously seen for this at urgent care and had negative COVID, flu, and strep testing.  He states that he had other respiratory symptoms but these have all resolved except for the cough.  Cough continues to persist and does not seem to be improving.  He uses inhaler without improvement.  No fever.  Patient Active Problem List   Diagnosis Date Noted   Bronchitis 10/05/2023   Irritable bowel syndrome with diarrhea 01/03/2022   Gastroesophageal reflux disease 09/26/2021   Annual physical exam 08/24/2021   Asthma exacerbation 08/25/2013    Social Hx   Social History   Socioeconomic History   Marital status: Single    Spouse name: Not on file   Number of children: Not on file   Years of education: Not on file   Highest education level: Not on file  Occupational History   Not on file  Tobacco Use   Smoking status: Former   Smokeless tobacco: Never   Tobacco comments:    Nicotine pouches.   Substance and Sexual Activity   Alcohol use: Yes    Comment: 4 beer on the weekend   Drug use: Never   Sexual activity: Not on file  Other Topics Concern   Not on file  Social History Narrative   Not on file   Social Drivers of Health   Financial Resource Strain: Not on file  Food Insecurity: Not on file  Transportation Needs: Not on file  Physical Activity: Not on file  Stress: Not on file  Social Connections: Not on file    Review of Systems Per HPI  Objective:  BP (!) 146/76   Pulse 99   Temp 98.6 F (37 C)   Ht 6' (1.829 m)   Wt 191 lb (86.6 kg)   SpO2 99%   BMI 25.90 kg/m      10/03/2023   11:08 AM 07/30/2023    3:03 PM 10/02/2022    9:52 AM  BP/Weight  Systolic BP 146 136  115  Diastolic BP 76 79 73  Wt. (Lbs) 191 188.4 180.6  BMI 25.9 kg/m2 25.55 kg/m2 24.46 kg/m2    Physical Exam Vitals and nursing note reviewed.  Constitutional:      General: He is not in acute distress.    Appearance: Normal appearance.  HENT:     Head: Normocephalic and atraumatic.  Eyes:     General:        Right eye: No discharge.        Left eye: No discharge.     Conjunctiva/sclera: Conjunctivae normal.  Cardiovascular:     Rate and Rhythm: Normal rate and regular rhythm.  Pulmonary:     Effort: Pulmonary effort is normal.     Breath sounds: Normal breath sounds. No wheezing, rhonchi or rales.  Neurological:     Mental Status: He is alert.  Psychiatric:        Mood and Affect: Mood normal.        Behavior: Behavior normal.     Lab Results  Component Value Date   WBC 6.1 09/10/2022   HGB 15.0 09/10/2022   HCT 43.5 09/10/2022   PLT 305 09/10/2022   GLUCOSE 96  09/10/2022   CHOL 171 09/10/2022   TRIG 82 09/10/2022   HDL 41 09/10/2022   LDLCALC 115 (H) 09/10/2022   ALT 31 09/10/2022   AST 19 09/10/2022   NA 139 09/10/2022   K 4.1 09/10/2022   CL 102 09/10/2022   CREATININE 0.97 09/10/2022   BUN 10 09/10/2022   CO2 21 09/10/2022     Assessment & Plan:   Problem List Items Addressed This Visit       Respiratory   Bronchitis - Primary   Treating with Doxy, Promethazine  DM, and prednisone .  I have refilled his asthma medications.      Other Visit Diagnoses       Asthma due to seasonal allergies       Relevant Medications   albuterol  (VENTOLIN  HFA) 108 (90 Base) MCG/ACT inhaler   budesonide -formoterol  (SYMBICORT ) 160-4.5 MCG/ACT inhaler   predniSONE  (DELTASONE ) 50 MG tablet       Meds ordered this encounter  Medications   albuterol  (VENTOLIN  HFA) 108 (90 Base) MCG/ACT inhaler    Sig: Inhale 1-2 puffs into the lungs every 6 (six) hours as needed for wheezing or shortness of breath.    Dispense:  18 each    Refill:  1   budesonide -formoterol   (SYMBICORT ) 160-4.5 MCG/ACT inhaler    Sig: Inhale 2 puffs into the lungs 2 (two) times daily.    Dispense:  1 each    Refill:  3   predniSONE  (DELTASONE ) 50 MG tablet    Sig: Take 1 tablet (50 mg total) by mouth daily for 5 days.    Dispense:  5 tablet    Refill:  0   promethazine -dextromethorphan (PROMETHAZINE -DM) 6.25-15 MG/5ML syrup    Sig: Take 5 mLs by mouth 4 (four) times daily as needed for cough.    Dispense:  118 mL    Refill:  0   doxycycline  (VIBRA -TABS) 100 MG tablet    Sig: Take 1 tablet (100 mg total) by mouth 2 (two) times daily.    Dispense:  14 tablet    Refill:  0    Follow-up:  Return if symptoms worsen or fail to improve.  Jacqulyn Ahle DO [facility]

## 2023-11-05 ENCOUNTER — Ambulatory Visit: Payer: BC Managed Care – PPO | Admitting: Family Medicine

## 2023-11-05 VITALS — BP 135/85 | HR 101 | Temp 98.4°F | Ht 72.0 in | Wt 189.0 lb

## 2023-11-05 DIAGNOSIS — I10 Essential (primary) hypertension: Secondary | ICD-10-CM | POA: Diagnosis not present

## 2023-11-05 NOTE — Progress Notes (Signed)
Subjective:  Patient ID: Jeffery Arnold, male    DOB: Feb 18, 2001  Age: 23 y.o. MRN: [id number]  CC:  Elevated BP  HPI:  23 year old male presents for evaluation of the above.  Patient reports recent blood pressure elevation.  Last night was 157/104.  He states that he checked his blood pressure after feeling flushed.  Has a family history of hypertension.  BP 131/85 today.  He has had prior elevated blood pressures with illness.  At other previous visits he has had normal blood pressure.  This is out of the norm for him.  Denies any current use of medications.  He states that the only supplement he is taking is "total beets".  He has discontinued caffeine.  No reports of substance use.  No chest pain.  No shortness of breath.  Patient Active Problem List   Diagnosis Date Noted   Hypertension 11/05/2023   Irritable bowel syndrome with diarrhea 01/03/2022   Gastroesophageal reflux disease 09/26/2021   Annual physical exam 08/24/2021   Asthma exacerbation 08/25/2013    Social Hx   Social History   Socioeconomic History   Marital status: Single    Spouse name: Not on file   Number of children: Not on file   Years of education: Not on file   Highest education level: Not on file  Occupational History   Not on file  Tobacco Use   Smoking status: Former   Smokeless tobacco: Never   Tobacco comments:    Nicotine pouches.   Substance and Sexual Activity   Alcohol use: Yes    Comment: 4 beer on the weekend   Drug use: Never   Sexual activity: Not on file  Other Topics Concern   Not on file  Social History Narrative   Not on file   Social Drivers of Health   Financial Resource Strain: Not on file  Food Insecurity: Not on file  Transportation Needs: Not on file  Physical Activity: Not on file  Stress: Not on file  Social Connections: Not on file    Review of Systems Per HPI  Objective:  BP 135/85   Pulse (!) 101   Temp 98.4 F (36.9 C)   Ht 6' (1.829 m)   Wt  189 lb (85.7 kg)   SpO2 98%   BMI 25.63 kg/m      11/05/2023   10:46 PM 11/05/2023    4:00 PM 10/03/2023   11:08 AM  BP/Weight  Systolic BP 135 141 146  Diastolic BP 85 90 76  Wt. (Lbs)  189 191  BMI  25.63 kg/m2 25.9 kg/m2    Physical Exam Vitals and nursing note reviewed.  Constitutional:      General: He is not in acute distress.    Appearance: Normal appearance.  HENT:     Head: Normocephalic and atraumatic.  Eyes:     General:        Right eye: No discharge.        Left eye: No discharge.     Conjunctiva/sclera: Conjunctivae normal.  Cardiovascular:     Rate and Rhythm: Normal rate and regular rhythm.  Pulmonary:     Effort: Pulmonary effort is normal.     Breath sounds: Normal breath sounds. No wheezing, rhonchi or rales.  Neurological:     Mental Status: He is alert.  Psychiatric:        Mood and Affect: Mood normal.        Behavior: Behavior normal.  Lab Results  Component Value Date   WBC 6.1 09/10/2022   HGB 15.0 09/10/2022   HCT 43.5 09/10/2022   PLT 305 09/10/2022   GLUCOSE 96 09/10/2022   CHOL 171 09/10/2022   TRIG 82 09/10/2022   HDL 41 09/10/2022   LDLCALC 115 (H) 09/10/2022   ALT 31 09/10/2022   AST 19 09/10/2022   NA 139 09/10/2022   K 4.1 09/10/2022   CL 102 09/10/2022   CREATININE 0.97 09/10/2022   BUN 10 09/10/2022   CO2 21 09/10/2022     Assessment & Plan:   Problem List Items Addressed This Visit       Cardiovascular and Mediastinum   Hypertension - Primary   Given acute presentation and young age, concern for secondary hypertension.  Obtaining further evaluation today with laboratory studies.  May need assessment of renal arteries.  Awaiting labs prior to arranging this.  Advised to monitor his blood pressure closely at home and keep a log.  We discussed the proper way to take her blood pressure.  He will follow-up in 2 weeks.      Relevant Orders   CBC   CMP14+EGFR   Microalbumin / creatinine urine ratio   TSH    Aldosterone + renin activity w/ ratio    Follow-up:  Return in about 2 weeks (around 11/19/2023).  Everlene Other DO [facility]

## 2023-11-05 NOTE — Assessment & Plan Note (Signed)
Given acute presentation and young age, concern for secondary hypertension.  Obtaining further evaluation today with laboratory studies.  May need assessment of renal arteries.  Awaiting labs prior to arranging this.  Advised to monitor his blood pressure closely at home and keep a log.  We discussed the proper way to take her blood pressure.  He will follow-up in 2 weeks.

## 2023-11-05 NOTE — Patient Instructions (Signed)
Labs today.  We will call with results.  Check BP daily.   Follow up in 2 weeks.

## 2023-11-11 ENCOUNTER — Encounter: Payer: Self-pay | Admitting: Family Medicine

## 2023-11-13 LAB — CMP14+EGFR
ALT: 31 [IU]/L (ref 0–44)
AST: 20 [IU]/L (ref 0–40)
Albumin: 5.2 g/dL (ref 4.3–5.2)
Alkaline Phosphatase: 132 [IU]/L — ABNORMAL HIGH (ref 44–121)
BUN/Creatinine Ratio: 9 (ref 9–20)
BUN: 10 mg/dL (ref 6–20)
Bilirubin Total: 0.4 mg/dL (ref 0.0–1.2)
CO2: 24 mmol/L (ref 20–29)
Calcium: 10.2 mg/dL (ref 8.7–10.2)
Chloride: 99 mmol/L (ref 96–106)
Creatinine, Ser: 1.06 mg/dL (ref 0.76–1.27)
Globulin, Total: 2.8 g/dL (ref 1.5–4.5)
Glucose: 103 mg/dL — ABNORMAL HIGH (ref 70–99)
Potassium: 4.1 mmol/L (ref 3.5–5.2)
Sodium: 142 mmol/L (ref 134–144)
Total Protein: 8 g/dL (ref 6.0–8.5)
eGFR: 101 mL/min/{1.73_m2} (ref >59)

## 2023-11-13 LAB — MICROALBUMIN / CREATININE URINE RATIO
Creatinine, Urine: 80 mg/dL
Microalb/Creat Ratio: <4 mg/g{creat} (ref 0–29)
Microalbumin, Urine: <3 ug/mL

## 2023-11-13 LAB — CBC
Hematocrit: 47.4 % (ref 37.5–51.0)
Hemoglobin: 15.2 g/dL (ref 13.0–17.7)
MCH: 29 pg (ref 26.6–33.0)
MCHC: 32.1 g/dL (ref 31.5–35.7)
MCV: 91 fL (ref 79–97)
Platelets: 345 10*3/uL (ref 150–450)
RBC: 5.24 x10E6/uL (ref 4.14–5.80)
RDW: 12.3 % (ref 11.6–15.4)
WBC: 9 10*3/uL (ref 3.4–10.8)

## 2023-11-13 LAB — TSH: TSH: 0.89 u[IU]/mL (ref 0.450–4.500)

## 2023-11-13 LAB — ALDOSTERONE + RENIN ACTIVITY W/ RATIO
Aldos/Renin Ratio: 0.8 (ref 0.0–30.0)
Aldosterone: 1.3 ng/dL (ref 0.0–30.0)
Renin Activity, Plasma: 1.727 ng/mL/h (ref 0.167–5.380)

## 2023-11-18 ENCOUNTER — Other Ambulatory Visit: Payer: Self-pay | Admitting: Family Medicine

## 2023-11-18 ENCOUNTER — Encounter: Payer: Self-pay | Admitting: Family Medicine

## 2023-11-18 DIAGNOSIS — R509 Fever, unspecified: Secondary | ICD-10-CM | POA: Diagnosis not present

## 2023-11-18 DIAGNOSIS — R0602 Shortness of breath: Secondary | ICD-10-CM | POA: Diagnosis not present

## 2023-11-18 DIAGNOSIS — R112 Nausea with vomiting, unspecified: Secondary | ICD-10-CM | POA: Diagnosis not present

## 2023-11-18 DIAGNOSIS — R197 Diarrhea, unspecified: Secondary | ICD-10-CM | POA: Diagnosis not present

## 2023-11-18 DIAGNOSIS — K529 Noninfective gastroenteritis and colitis, unspecified: Secondary | ICD-10-CM | POA: Diagnosis not present

## 2023-11-18 MED ORDER — ONDANSETRON 4 MG PO TBDP: 4.0000 mg | ORAL_TABLET | Freq: Three times a day (TID) | ORAL | 0 refills | Status: AC | PRN

## 2023-11-20 ENCOUNTER — Ambulatory Visit: Payer: BC Managed Care – PPO | Admitting: Family Medicine

## 2023-11-20 VITALS — BP 122/83 | HR 84 | Temp 98.6°F | Ht 72.0 in | Wt 187.0 lb

## 2023-11-20 DIAGNOSIS — J111 Influenza due to unidentified influenza virus with other respiratory manifestations: Secondary | ICD-10-CM

## 2023-11-20 DIAGNOSIS — I10 Essential (primary) hypertension: Secondary | ICD-10-CM

## 2023-11-20 MED ORDER — OSELTAMIVIR PHOSPHATE 75 MG PO CAPS: 75.0000 mg | ORAL_CAPSULE | Freq: Two times a day (BID) | ORAL | 0 refills | Status: AC

## 2023-11-20 NOTE — Patient Instructions (Signed)
 Log BP.  Medication as prescribed.  Follow up in 1 month

## 2023-11-21 DIAGNOSIS — J111 Influenza due to unidentified influenza virus with other respiratory manifestations: Secondary | ICD-10-CM | POA: Insufficient documentation

## 2023-11-21 NOTE — Assessment & Plan Note (Signed)
Improved. Will continue to monitor.

## 2023-11-21 NOTE — Assessment & Plan Note (Signed)
 Clinical picture consistent with influenza.  Afebrile here today.  Treating with Tamiflu.

## 2023-11-21 NOTE — Progress Notes (Signed)
 Subjective:  Patient ID: Jeffery Arnold, male    DOB: 11/30/2000  Age: 23 y.o. MRN: [id number]  CC:   Chief Complaint  Patient presents with   follow up blood work results    HPI:  23 year old male presents for follow-up.  He is currently sick as well.  Patient was recently seen with persistent elevated blood pressures.  Workup for secondary hypertension has been negative thus far.  Blood pressure back down today.  Patient reports that he has been sick since Tuesday.  He has had fever, nausea, vomiting, body aches.  Needs a note for work.  Concern for influenza.  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   Substance and Sexual Activity   Alcohol use: Yes    Comment: 4 beer on the weekend   Drug use: Never   Sexual activity: Not on file  Other Topics Concern   Not on file  Social History Narrative   Not on file   Social Drivers of Health   Financial Resource Strain: Not on file  Food Insecurity: Not on file  Transportation Needs: Not on file  Physical Activity: Not on file  Stress: Not on file  Social Connections: Not on file    Review of Systems Per HPI  Objective:  BP 122/83   Pulse 84   Temp 98.6 F (37 C)   Ht 6' (1.829 m)   Wt 187 lb (84.8 kg)   SpO2 98%   BMI 25.36 kg/m      11/20/2023    3:37 PM 11/05/2023   10:46 PM 11/05/2023    4:00 PM  BP/Weight  Systolic  BP 122 829 141  Diastolic BP 83 85 90  Wt. (Lbs) 187  189  BMI 25.36 kg/m2  25.63 kg/m2    Physical Exam Vitals and nursing note reviewed.  Constitutional:      General: He is not in acute distress. HENT:     Head: Normocephalic and atraumatic.  Cardiovascular:     Rate and Rhythm: Normal rate and regular rhythm.  Pulmonary:     Effort: Pulmonary effort is normal.     Breath sounds: Normal breath sounds. No wheezing, rhonchi or rales.  Neurological:     Mental Status: He is alert.  Psychiatric:        Mood and Affect: Mood normal.        Behavior: Behavior normal.     Lab Results  Component Value Date   WBC 9.0 11/05/2023   HGB 15.2 11/05/2023   HCT 47.4 11/05/2023   PLT 345 11/05/2023   GLUCOSE 103 (H) 11/05/2023   CHOL 171 09/10/2022   TRIG 82 09/10/2022   HDL 41 09/10/2022   LDLCALC 115 (H) 09/10/2022   ALT 31 11/05/2023   AST 20 11/05/2023   NA 142 11/05/2023  K 4.1 11/05/2023   CL 99 11/05/2023   CREATININE 1.06 11/05/2023   BUN 10 11/05/2023   CO2 24 11/05/2023   TSH 0.890 11/05/2023     Assessment & Plan:  Hypertension, unspecified type Assessment & Plan: Improved.  Will continue to monitor.   Influenza Assessment & Plan: Clinical picture consistent with influenza.  Afebrile here today.  Treating with Tamiflu.  Orders: -     Oseltamivir Phosphate; Take 1 capsule (75 mg total) by mouth 2 (two) times daily.  Dispense: 10 capsule; Refill: 0    Follow-up:  1 month  Nasser Ku DO [facility]

## 2023-12-01 ENCOUNTER — Encounter: Payer: Self-pay | Admitting: Family Medicine

## 2023-12-02 ENCOUNTER — Other Ambulatory Visit: Payer: Self-pay | Admitting: Family Medicine

## 2023-12-02 MED ORDER — PREDNISONE 50 MG PO TABS: 50.0000 mg | ORAL_TABLET | Freq: Every day | ORAL | 0 refills | Status: AC

## 2023-12-18 ENCOUNTER — Ambulatory Visit: Payer: BC Managed Care – PPO | Admitting: Family Medicine

## 2024-06-14 ENCOUNTER — Ambulatory Visit: Admitting: Physician Assistant

## 2024-06-14 ENCOUNTER — Encounter: Payer: Self-pay | Admitting: Physician Assistant

## 2024-06-14 VITALS — BP 125/89 | Wt 192.6 lb

## 2024-06-14 DIAGNOSIS — R5383 Other fatigue: Secondary | ICD-10-CM

## 2024-06-14 DIAGNOSIS — R519 Headache, unspecified: Secondary | ICD-10-CM

## 2024-06-14 DIAGNOSIS — W57XXXA Bitten or stung by nonvenomous insect and other nonvenomous arthropods, initial encounter: Secondary | ICD-10-CM

## 2024-06-14 NOTE — Progress Notes (Signed)
   Acute Office Visit  Subjective:     Patient ID: Jeffery Arnold, male    DOB: Jun 06, 2001, 23 y.o.   MRN: [id number]   Discussed the use of AI scribe software for clinical note transcription with the patient, who gave verbal consent to proceed.  History of Present Illness Jeffery Arnold is a 23 year old male who presents with fatigue, joint pain, and headaches, concerned about possible Lyme disease.  For the past week and a half, he experiences extreme fatigue, joint pain, and intermittent headaches. Today is the only day without a headache. He has no rashes or redness at tick bite sites and no fevers. He recently removed at least four ticks, with the last one removed last Thursday. His outdoor activities for work and hunting increase his tick exposure.  Joint pain is primarily in both knees, described as achy without swelling or redness, and has been more noticeable in the past week. It affects his ability to remain seated while hunting. There is no numbness, tingling, or significant popping in the knees, although occasional popping occurs.  He has IBS, limiting his use of ibuprofen for joint pain due to gastrointestinal discomfort. He also has trouble sleeping, attributed to working odd hours and being on call at [employer], often at night. No chest pain, palpitations, facial drooping, speech changes, numbness, or tingling.    ROS      Objective:     BP 125/89   Wt 192 lb 9.6 oz (87.4 kg)   BMI 26.12 kg/m   Physical Exam  No results found for any visits on 06/14/24.      Assessment & Plan:  ***  No follow-ups on file.  Charmaine Sulayman Manning, PA-C

## 2024-06-15 NOTE — Assessment & Plan Note (Signed)
 Multiple tick bites with fatigue, headaches, and bilateral knee pain. Symptoms likely related to outdoor work and irregular hours. No systemic symptoms. Lyme disease unlikely due to absence of rash or fever - Order Lyme disease antibody blood test. Discussed nonspecific nature of this test.  - Warning signs and follow up precautions discussed.

## 2024-06-16 LAB — LYME DISEASE SEROLOGY W/REFLEX: Lyme Total Antibody EIA: NEGATIVE

## 2024-06-17 ENCOUNTER — Ambulatory Visit: Payer: Self-pay | Admitting: Physician Assistant

## 2024-07-08 ENCOUNTER — Encounter: Payer: Self-pay | Admitting: Gastroenterology

## 2024-07-21 DIAGNOSIS — M9902 Segmental and somatic dysfunction of thoracic region: Secondary | ICD-10-CM | POA: Diagnosis not present

## 2024-07-21 DIAGNOSIS — M9903 Segmental and somatic dysfunction of lumbar region: Secondary | ICD-10-CM | POA: Diagnosis not present

## 2024-07-21 DIAGNOSIS — M62830 Muscle spasm of back: Secondary | ICD-10-CM | POA: Diagnosis not present

## 2024-07-21 DIAGNOSIS — M546 Pain in thoracic spine: Secondary | ICD-10-CM | POA: Diagnosis not present

## 2024-07-23 DIAGNOSIS — M62830 Muscle spasm of back: Secondary | ICD-10-CM | POA: Diagnosis not present

## 2024-07-23 DIAGNOSIS — M9902 Segmental and somatic dysfunction of thoracic region: Secondary | ICD-10-CM | POA: Diagnosis not present

## 2024-07-23 DIAGNOSIS — M9903 Segmental and somatic dysfunction of lumbar region: Secondary | ICD-10-CM | POA: Diagnosis not present

## 2024-07-23 DIAGNOSIS — M546 Pain in thoracic spine: Secondary | ICD-10-CM | POA: Diagnosis not present

## 2024-07-30 DIAGNOSIS — M62830 Muscle spasm of back: Secondary | ICD-10-CM | POA: Diagnosis not present

## 2024-07-30 DIAGNOSIS — M9903 Segmental and somatic dysfunction of lumbar region: Secondary | ICD-10-CM | POA: Diagnosis not present

## 2024-07-30 DIAGNOSIS — M9902 Segmental and somatic dysfunction of thoracic region: Secondary | ICD-10-CM | POA: Diagnosis not present

## 2024-07-30 DIAGNOSIS — M546 Pain in thoracic spine: Secondary | ICD-10-CM | POA: Diagnosis not present

## 2024-08-06 DIAGNOSIS — M9902 Segmental and somatic dysfunction of thoracic region: Secondary | ICD-10-CM | POA: Diagnosis not present

## 2024-08-06 DIAGNOSIS — M62830 Muscle spasm of back: Secondary | ICD-10-CM | POA: Diagnosis not present

## 2024-08-06 DIAGNOSIS — M9903 Segmental and somatic dysfunction of lumbar region: Secondary | ICD-10-CM | POA: Diagnosis not present

## 2024-08-06 DIAGNOSIS — M546 Pain in thoracic spine: Secondary | ICD-10-CM | POA: Diagnosis not present

## 2024-08-20 DIAGNOSIS — R059 Cough, unspecified: Secondary | ICD-10-CM | POA: Diagnosis not present

## 2024-08-20 DIAGNOSIS — J029 Acute pharyngitis, unspecified: Secondary | ICD-10-CM | POA: Diagnosis not present

## 2024-08-20 DIAGNOSIS — J45901 Unspecified asthma with (acute) exacerbation: Secondary | ICD-10-CM | POA: Diagnosis not present

## 2024-08-20 DIAGNOSIS — R030 Elevated blood-pressure reading, without diagnosis of hypertension: Secondary | ICD-10-CM | POA: Diagnosis not present

## 2024-08-27 ENCOUNTER — Encounter: Payer: Self-pay | Admitting: Family Medicine

## 2024-08-27 DIAGNOSIS — M9902 Segmental and somatic dysfunction of thoracic region: Secondary | ICD-10-CM | POA: Diagnosis not present

## 2024-08-27 DIAGNOSIS — M62830 Muscle spasm of back: Secondary | ICD-10-CM | POA: Diagnosis not present

## 2024-08-27 DIAGNOSIS — M9903 Segmental and somatic dysfunction of lumbar region: Secondary | ICD-10-CM | POA: Diagnosis not present

## 2024-08-27 DIAGNOSIS — M546 Pain in thoracic spine: Secondary | ICD-10-CM | POA: Diagnosis not present

## 2024-08-30 ENCOUNTER — Ambulatory Visit: Payer: Self-pay | Admitting: Family Medicine

## 2024-08-30 ENCOUNTER — Other Ambulatory Visit: Payer: Self-pay | Admitting: Family Medicine

## 2024-08-30 ENCOUNTER — Ambulatory Visit (HOSPITAL_COMMUNITY)
Admission: RE | Admit: 2024-08-30 | Discharge: 2024-08-30 | Disposition: A | Source: Ambulatory Visit | Attending: Family Medicine | Admitting: Family Medicine

## 2024-08-30 DIAGNOSIS — R0602 Shortness of breath: Secondary | ICD-10-CM

## 2024-08-30 MED ORDER — AZITHROMYCIN 250 MG PO TABS: ORAL_TABLET | ORAL | 0 refills | Status: AC

## 2024-10-15 ENCOUNTER — Other Ambulatory Visit: Payer: Self-pay | Admitting: Family Medicine

## 2024-10-15 ENCOUNTER — Other Ambulatory Visit: Payer: Self-pay | Admitting: Gastroenterology

## 2024-10-15 DIAGNOSIS — J45909 Unspecified asthma, uncomplicated: Secondary | ICD-10-CM

## 2024-10-15 DIAGNOSIS — K580 Irritable bowel syndrome with diarrhea: Secondary | ICD-10-CM
